# Patient Record
Sex: Male | Born: 1975 | Race: White | Hispanic: No | Marital: Single
Health system: Midwestern US, Community
[De-identification: ages and names within clinical notes are randomized; demographics above are authoritative.]

## PROBLEM LIST (undated history)

## (undated) DIAGNOSIS — I82409 Acute embolism and thrombosis of unspecified deep veins of unspecified lower extremity: Secondary | ICD-10-CM

## (undated) DIAGNOSIS — I2699 Other pulmonary embolism without acute cor pulmonale: Secondary | ICD-10-CM

## (undated) DIAGNOSIS — I10 Essential (primary) hypertension: Secondary | ICD-10-CM

## (undated) DIAGNOSIS — E119 Type 2 diabetes mellitus without complications: Secondary | ICD-10-CM

## (undated) DIAGNOSIS — M67912 Unspecified disorder of synovium and tendon, left shoulder: Secondary | ICD-10-CM

## (undated) DIAGNOSIS — N200 Calculus of kidney: Secondary | ICD-10-CM

## (undated) HISTORY — DX: Calculus of kidney: N20.0

## (undated) HISTORY — PX: BACK SURGERY: SHX140

## (undated) HISTORY — PX: CHOLECYSTECTOMY: SHX55

## (undated) HISTORY — PX: APPENDECTOMY (OPEN): SHX54

## (undated) HISTORY — DX: Type 2 diabetes mellitus without complications: E11.9

## (undated) HISTORY — PX: APPENDECTOMY: SHX54

## (undated) HISTORY — PX: FOOT AMPUTATION: SHX951

---

## 2011-09-12 ENCOUNTER — Observation Stay
Admission: EM | Admit: 2011-09-12 | Disposition: A | Discharge status: Home / Self Care | Payer: Self-pay | Source: Ambulatory Visit | Admitting: Surgery

## 2011-10-30 ENCOUNTER — Inpatient Hospital Stay
Admission: EM | Admit: 2011-10-30 | Disposition: A | Discharge status: Home / Self Care | Payer: Self-pay | Source: Ambulatory Visit | Admitting: Internal Medicine

## 2012-07-26 LAB — CBC AND DIFFERENTIAL
Basophils %: 0.3 % (ref 0.0–3.0)
Basophils %: 0.7 % (ref 0.0–3.0)
Basophils Absolute: 0 10*3/uL (ref 0.0–0.3)
Basophils Absolute: 0 10*3/uL (ref 0.0–0.3)
Eosinophils %: 3 % (ref 0.0–7.0)
Eosinophils %: 5.6 % (ref 0.0–7.0)
Eosinophils Absolute: 0.3 10*3/uL (ref 0.0–0.8)
Eosinophils Absolute: 0.3 10*3/uL (ref 0.0–0.8)
Hematocrit: 38.3 % — ABNORMAL LOW (ref 39.0–52.5)
Hematocrit: 45.7 % (ref 39.0–52.5)
Hemoglobin: 12.7 gm/dL — ABNORMAL LOW (ref 13.0–17.5)
Hemoglobin: 15.2 gm/dL (ref 13.0–17.5)
Lymphocytes Absolute: 1.3 10*3/uL (ref 0.6–5.1)
Lymphocytes Absolute: 1.5 10*3/uL (ref 0.6–5.1)
Lymphocytes: 15.2 % (ref 15.0–46.0)
Lymphocytes: 25.2 % (ref 15.0–46.0)
MCH: 30 pg (ref 28–35)
MCH: 30 pg (ref 28–35)
MCHC: 33 gm/dL (ref 33–37)
MCHC: 33 gm/dL (ref 33–37)
MCV: 89 fL (ref 80–100)
MCV: 91 fL (ref 80–100)
MPV: 8.6 fL (ref 8.0–12.0)
MPV: 8.8 fL (ref 8.0–12.0)
Monocytes Absolute: 0.6 10*3/uL (ref 0.1–1.7)
Monocytes Absolute: 0.8 10*3/uL (ref 0.1–1.7)
Monocytes: 10 % (ref 3.0–15.0)
Monocytes: 9.8 % (ref 3.0–15.0)
Neutrophils %: 58.5 % (ref 42.0–78.0)
Neutrophils %: 71.7 % (ref 42.0–78.0)
Neutrophils Absolute: 3.5 10*3/uL (ref 1.7–8.6)
Neutrophils Absolute: 6.1 10*3/uL (ref 1.7–8.6)
PLT CT: 198 10*3/uL (ref 130–440)
PLT CT: 246 10*3/uL (ref 130–440)
RBC: 4.21 10*6/uL (ref 4.00–5.70)
RBC: 5.12 10*6/uL (ref 4.00–5.70)
RDW: 13.1 % (ref 12.0–15.0)
RDW: 13.1 % (ref 12.0–15.0)
WBC: 6 10*3/uL (ref 4.00–11.00)
WBC: 8.5 10*3/uL (ref 4.00–11.00)

## 2012-07-26 LAB — COMPREHENSIVE METABOLIC PANEL
ALT: 32 U/L (ref 0–55)
ALT: 40 U/L (ref 0–55)
AST (SGOT): 15 U/L (ref 10–42)
AST (SGOT): 16 U/L (ref 10–42)
Albumin/Globulin Ratio: 1.07 Ratio (ref 0.70–1.50)
Albumin/Globulin Ratio: 1.11 Ratio (ref 0.70–1.50)
Albumin: 3 gm/dL — ABNORMAL LOW (ref 3.5–5.0)
Albumin: 4 gm/dL (ref 3.5–5.0)
Alkaline Phosphatase: 44 U/L (ref 40–145)
Alkaline Phosphatase: 69 U/L (ref 40–145)
Anion Gap: 12 Gap (ref 7.0–16.0)
Anion Gap: 19.7 Gap — ABNORMAL HIGH (ref 7.0–16.0)
BUN / Creatinine Ratio: 12 Ratio (ref 10.0–30.0)
BUN / Creatinine Ratio: 12.3 Ratio (ref 10.0–30.0)
BUN: 12 mg/dL (ref 7–22)
BUN: 9 mg/dL (ref 7–22)
Bilirubin, Total: 0.4 mg/dL (ref 0.1–1.2)
Bilirubin, Total: 0.4 mg/dL (ref 0.1–1.2)
CO2: 24 mMol/L (ref 20.0–30.0)
CO2: 25 mMol/L (ref 20.0–30.0)
Calcium: 8.5 mg/dL (ref 8.5–10.5)
Calcium: 9.9 mg/dL (ref 8.5–10.5)
Chloride: 103 mMol/L (ref 98–110)
Chloride: 96 mMol/L — ABNORMAL LOW (ref 98–110)
Creatinine: 0.73 mg/dL — ABNORMAL LOW (ref 0.80–1.30)
Creatinine: 1 mg/dL (ref 0.80–1.30)
EGFR: >60 mL/min/{1.73_m2}
EGFR: >60 mL/min/{1.73_m2}
Globulin: 2.8 gm/dL (ref 2.0–4.0)
Globulin: 3.6 gm/dL (ref 2.0–4.0)
Glucose: 260 mg/dL — ABNORMAL HIGH (ref 70–99)
Glucose: 393 mg/dL — ABNORMAL HIGH (ref 70–99)
Osmolality Calc: 280 mOsm/kg (ref 275–300)
Osmolality Calc: 288 mOsm/kg (ref 275–300)
Potassium: 3.7 mMol/L (ref 3.5–5.3)
Potassium: 4 mMol/L (ref 3.5–5.3)
Protein, Total: 5.8 gm/dL — ABNORMAL LOW (ref 6.0–8.3)
Protein, Total: 7.6 gm/dL (ref 6.0–8.3)
Sodium: 136 mMol/L (ref 136–147)
Sodium: 136 mMol/L (ref 136–147)

## 2012-07-26 LAB — AMYLASE: Amylase: 29 U/L — ABNORMAL LOW (ref 30–135)

## 2012-07-26 LAB — URINALYSIS
Bilirubin, UA: NEGATIVE mg/dL
Blood, UA: NEGATIVE mg/dL
Glucose, UA: >=1000 mg/dL — AB
Ketones UA: 15 mg/dL — AB
Leukocyte Esterase, UA: NEGATIVE Leu/uL
Nitrite, UA: NEGATIVE
RBC, UA: NONE SEEN /hpf
Urine Protein: NEGATIVE mg/dL
Urine Specific Gravity: 1.015 (ref 1.001–1.040)
Urobilinogen, UA: 0.2 mg/dL
pH, Urine: 6 pH (ref 5.0–8.0)

## 2012-07-26 LAB — LIPASE: Lipase: 35 U/L (ref 8–78)

## 2012-07-26 LAB — VH DEXTROSE STICK GLUCOSE
Glucose POCT: 284 mg/dL — ABNORMAL HIGH (ref 70–99)
Glucose POCT: 296 mg/dL — ABNORMAL HIGH (ref 70–99)

## 2012-07-26 LAB — LACTIC ACID, PLASMA: Lactic Acid: 2.16 mMol/L (ref 0.50–2.10)

## 2012-07-27 LAB — CBC AND DIFFERENTIAL
Basophils %: 0.1 % (ref 0.0–3.0)
Basophils %: 0.4 % (ref 0.0–3.0)
Basophils %: 0.5 % (ref 0.0–3.0)
Basophils Absolute: 0 10*3/uL (ref 0.0–0.3)
Basophils Absolute: 0 10*3/uL (ref 0.0–0.3)
Basophils Absolute: 0 10*3/uL (ref 0.0–0.3)
Eosinophils %: 2.6 % (ref 0.0–7.0)
Eosinophils %: 3.4 % (ref 0.0–7.0)
Eosinophils %: 5.6 % (ref 0.0–7.0)
Eosinophils Absolute: 0.2 10*3/uL (ref 0.0–0.8)
Eosinophils Absolute: 0.3 10*3/uL (ref 0.0–0.8)
Eosinophils Absolute: 0.5 10*3/uL (ref 0.0–0.8)
Hematocrit: 36.7 % — ABNORMAL LOW (ref 39.0–52.5)
Hematocrit: 39.9 % (ref 39.0–52.5)
Hematocrit: 43 % (ref 39.0–52.5)
Hemoglobin: 12.2 gm/dL — ABNORMAL LOW (ref 13.0–17.5)
Hemoglobin: 13.1 gm/dL (ref 13.0–17.5)
Hemoglobin: 14 gm/dL (ref 13.0–17.5)
Lymphocytes Absolute: 1 10*3/uL (ref 0.6–5.1)
Lymphocytes Absolute: 1.5 10*3/uL (ref 0.6–5.1)
Lymphocytes Absolute: 2 10*3/uL (ref 0.6–5.1)
Lymphocytes: 11.4 % — ABNORMAL LOW (ref 15.0–46.0)
Lymphocytes: 19.4 % (ref 15.0–46.0)
Lymphocytes: 22.5 % (ref 15.0–46.0)
MCH: 30 pg (ref 28–35)
MCH: 30 pg (ref 28–35)
MCH: 30 pg (ref 28–35)
MCHC: 33 gm/dL (ref 33–37)
MCHC: 33 gm/dL (ref 33–37)
MCHC: 33 gm/dL (ref 33–37)
MCV: 90 fL (ref 80–100)
MCV: 90 fL (ref 80–100)
MCV: 91 fL (ref 80–100)
MPV: 8.3 fL (ref 8.0–12.0)
MPV: 8.4 fL (ref 8.0–12.0)
MPV: 8.5 fL (ref 8.0–12.0)
Monocytes Absolute: 0.6 10*3/uL (ref 0.1–1.7)
Monocytes Absolute: 0.7 10*3/uL (ref 0.1–1.7)
Monocytes Absolute: 0.8 10*3/uL (ref 0.1–1.7)
Monocytes: 6.7 % (ref 3.0–15.0)
Monocytes: 8.6 % (ref 3.0–15.0)
Monocytes: 9.3 % (ref 3.0–15.0)
Neutrophils %: 62.1 % (ref 42.0–78.0)
Neutrophils %: 68.2 % (ref 42.0–78.0)
Neutrophils %: 79.2 % — ABNORMAL HIGH (ref 42.0–78.0)
Neutrophils Absolute: 5.3 10*3/uL (ref 1.7–8.6)
Neutrophils Absolute: 5.4 10*3/uL (ref 1.7–8.6)
Neutrophils Absolute: 6.8 10*3/uL (ref 1.7–8.6)
PLT CT: 239 10*3/uL (ref 130–440)
PLT CT: 247 10*3/uL (ref 130–440)
PLT CT: 278 10*3/uL (ref 130–440)
RBC: 4.1 10*6/uL (ref 4.00–5.70)
RBC: 4.42 10*6/uL (ref 4.00–5.70)
RBC: 4.76 10*6/uL (ref 4.00–5.70)
RDW: 12.8 % (ref 12.0–15.0)
RDW: 12.9 % (ref 12.0–15.0)
RDW: 13 % (ref 12.0–15.0)
WBC: 7.7 10*3/uL (ref 4.00–11.00)
WBC: 8.6 10*3/uL (ref 4.00–11.00)
WBC: 8.7 10*3/uL (ref 4.00–11.00)

## 2012-07-27 LAB — COMPREHENSIVE METABOLIC PANEL
ALT: 34 U/L (ref 0–55)
ALT: 48 U/L (ref 0–55)
AST (SGOT): 16 U/L (ref 10–42)
AST (SGOT): 35 U/L (ref 10–42)
Albumin/Globulin Ratio: 1.11 Ratio (ref 0.70–1.50)
Albumin/Globulin Ratio: 1.13 Ratio (ref 0.70–1.50)
Albumin: 3.4 gm/dL — ABNORMAL LOW (ref 3.5–5.0)
Albumin: 3.9 gm/dL (ref 3.5–5.0)
Alkaline Phosphatase: 47 U/L (ref 40–145)
Alkaline Phosphatase: 60 U/L (ref 40–145)
Anion Gap: 11.8 Gap (ref 7.0–16.0)
Anion Gap: 16 Gap (ref 7.0–16.0)
BUN / Creatinine Ratio: 11.5 Ratio (ref 10.0–30.0)
BUN / Creatinine Ratio: 13.4 Ratio (ref 10.0–30.0)
BUN: 11 mg/dL (ref 7–22)
BUN: 9 mg/dL (ref 7–22)
Bilirubin, Total: 0.6 mg/dL (ref 0.1–1.2)
Bilirubin, Total: 0.6 mg/dL (ref 0.1–1.2)
CO2: 25 mMol/L (ref 20.0–30.0)
CO2: 27 mMol/L (ref 20.0–30.0)
Calcium: 8.7 mg/dL (ref 8.5–10.5)
Calcium: 9.8 mg/dL (ref 8.5–10.5)
Chloride: 100 mMol/L (ref 98–110)
Chloride: 103 mMol/L (ref 98–110)
Creatinine: 0.78 mg/dL — ABNORMAL LOW (ref 0.80–1.30)
Creatinine: 0.82 mg/dL (ref 0.80–1.30)
EGFR: >60 mL/min/{1.73_m2}
EGFR: >60 mL/min/{1.73_m2}
Globulin: 3 gm/dL (ref 2.0–4.0)
Globulin: 3.5 gm/dL (ref 2.0–4.0)
Glucose: 201 mg/dL — ABNORMAL HIGH (ref 70–99)
Glucose: 300 mg/dL — ABNORMAL HIGH (ref 70–99)
Osmolality Calc: 280 mOsm/kg (ref 275–300)
Osmolality Calc: 284 mOsm/kg (ref 275–300)
Potassium: 3.8 mMol/L (ref 3.5–5.3)
Potassium: 4 mMol/L (ref 3.5–5.3)
Protein, Total: 6.4 gm/dL (ref 6.0–8.3)
Protein, Total: 7.4 gm/dL (ref 6.0–8.3)
Sodium: 137 mMol/L (ref 136–147)
Sodium: 138 mMol/L (ref 136–147)

## 2012-07-27 LAB — URINALYSIS
Bilirubin, UA: NEGATIVE mg/dL
Blood, UA: NEGATIVE mg/dL
Glucose, UA: 500 mg/dL — AB
Ketones UA: NEGATIVE mg/dL
Leukocyte Esterase, UA: NEGATIVE Leu/uL
Nitrite, UA: NEGATIVE
RBC, UA: NONE SEEN /hpf
Urine Protein: NEGATIVE mg/dL
Urine Specific Gravity: 1.01 (ref 1.001–1.040)
Urobilinogen, UA: 0.2 mg/dL
pH, Urine: 6.5 pH (ref 5.0–8.0)

## 2012-07-27 LAB — VH DEXTROSE STICK GLUCOSE
Glucose POCT: 202 mg/dL — ABNORMAL HIGH (ref 70–99)
Glucose POCT: 235 mg/dL — ABNORMAL HIGH (ref 70–99)

## 2012-07-27 LAB — BASIC METABOLIC PANEL
Anion Gap: 14.8 Gap (ref 7.0–16.0)
BUN / Creatinine Ratio: 13.8 Ratio (ref 10.0–30.0)
BUN: 11 mg/dL (ref 7–22)
CO2: 24 mMol/L (ref 20.0–30.0)
Calcium: 9.1 mg/dL (ref 8.5–10.5)
Chloride: 104 mMol/L (ref 98–110)
Creatinine: 0.8 mg/dL (ref 0.80–1.30)
EGFR: >60 mL/min/{1.73_m2}
Glucose: 266 mg/dL — ABNORMAL HIGH (ref 70–99)
Osmolality Calc: 286 mOsm/kg (ref 275–300)
Potassium: 3.8 mMol/L (ref 3.5–5.3)
Sodium: 139 mMol/L (ref 136–147)

## 2012-07-27 LAB — LIPASE: Lipase: 17 U/L (ref 8–78)

## 2012-08-07 ENCOUNTER — Emergency Department
Admission: EM | Admit: 2012-08-07 | Disposition: A | Discharge status: Home / Self Care | Payer: Self-pay | Source: Ambulatory Visit

## 2012-09-10 LAB — COMPREHENSIVE METABOLIC PANEL
ALT: 38 U/L (ref 0–55)
AST (SGOT): 21 U/L (ref 10–42)
Albumin/Globulin Ratio: 1.16 Ratio (ref 0.70–1.50)
Albumin: 4.4 gm/dL (ref 3.5–5.0)
Alkaline Phosphatase: 73 U/L (ref 40–145)
Anion Gap: 15.8 mMol/L (ref 7.0–18.0)
BUN / Creatinine Ratio: 16.3 Ratio (ref 10.0–30.0)
BUN: 16 mg/dL (ref 7–22)
Bilirubin, Total: 0.6 mg/dL (ref 0.1–1.2)
CO2: 26 mMol/L (ref 20.0–30.0)
Calcium: 11.2 mg/dL — ABNORMAL HIGH (ref 8.5–10.5)
Chloride: 99 mMol/L (ref 98–110)
Creatinine: 0.98 mg/dL (ref 0.60–1.30)
EGFR: >60 mL/min/{1.73_m2}
Globulin: 3.8 gm/dL (ref 2.0–4.0)
Glucose: 246 mg/dL — ABNORMAL HIGH (ref 70–99)
Osmolality Calc: 283 mOsm/kg (ref 275–300)
Potassium: 3.8 mMol/L (ref 3.5–5.3)
Protein, Total: 8.2 gm/dL (ref 6.0–8.3)
Sodium: 137 mMol/L (ref 136–147)

## 2012-09-10 LAB — URINALYSIS, REFLEX TO MICROSCOPIC EXAM IF INDICATED
Blood, UA: NEGATIVE mg/dL
Leukocyte Esterase, UA: NEGATIVE Leu/uL
Nitrite, UA: NEGATIVE
Urine Protein: NEGATIVE mg/dL
Urine Specific Gravity: 1.036 (ref 1.001–1.040)
Urobilinogen, UA: 0.2 mg/dL
pH, Urine: 5 pH (ref 5.0–8.0)

## 2012-09-10 LAB — CBC AND DIFFERENTIAL
Basophils %: 0.9 % (ref 0.0–3.0)
Basophils Absolute: 0.1 10*3/uL (ref 0.0–0.3)
Eosinophils %: 2.9 % (ref 0.0–7.0)
Eosinophils Absolute: 0.3 10*3/uL (ref 0.0–0.8)
Hematocrit: 44.7 % (ref 39.0–52.5)
Hemoglobin: 15.1 gm/dL (ref 13.0–17.5)
Lymphocytes Absolute: 1.8 10*3/uL (ref 0.6–5.1)
Lymphocytes: 19.3 % (ref 15.0–46.0)
MCH: 30 pg (ref 28–35)
MCHC: 34 gm/dL (ref 33–37)
MCV: 89 fL (ref 80–100)
MPV: 8.7 fL (ref 8.0–12.0)
Monocytes Absolute: 0.7 10*3/uL (ref 0.1–1.7)
Monocytes: 7.4 % (ref 3.0–15.0)
Neutrophils %: 69.5 % (ref 42.0–78.0)
Neutrophils Absolute: 6.4 10*3/uL (ref 1.7–8.6)
PLT CT: 278 10*3/uL (ref 130–440)
RBC: 5 10*6/uL (ref 4.00–5.70)
RDW: 13.5 % (ref 12.0–15.0)
WBC: 9.2 10*3/uL (ref 4.0–11.0)

## 2012-09-10 LAB — LIPASE: Lipase: 25 U/L (ref 8–78)
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
## 2012-11-25 NOTE — Discharge Summary (Signed)
[facility]                               DISCHARGE SUMMARY              NAME: Henry Stephens, Henry Stephens                      ADMITTED:   09/12/2011       MR#:  [id number]                            DISCHARGED:       DOB:  1975/12/30                           ACCT#:      [id number]       _________________________________________________________________       DISCHARGE DIAGNOSIS:       Abdominal symptoms.              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  His symptoms were quite intense.  He       was evaluated in the ER and then admitted to the Surgical       Service.  The patient has a history of diabetes and history of       necrotizing fasciitis in the remote past.  He underwent an       evaluation, which included a CT scan, which was rather       unrevealing, and then he had a hepatobiliary scan, which was       unrevealing, however, the administration of CCK did reproduce       his symptoms somewhat.  The patient possibly has a symptomatic       gallbladder.  I discussed a bunch of management options with       him, this included further evaluation of his gallbladder and       possible cholecystectomy.  The patient admits that he wishes to       be discharged home.  He is feeling better, and lives in       [place], [place].  His family and his physicians are back in       [place].  The patient has a truck, which is in the driveway of       the hospital, and he needs to get that home also.              Per the patient's request, he will be discharged home.  He is       feeling better.  His abdominal exam is improved.  He will follow       up as soon as he can with his physician back in [place],       [place] and hopefully, he will be further evaluated.  He very       well might need a cholecystectomy at some point.               The patient knows to return to the emergency room nearest to him       if his symptoms should  become intense again, or should he have       any nausea, vomiting, fevers or further right upper quadrant       symptoms.  He agrees.  The patient will continue all his usual       home medications.  He will call with any questions or concerns       in the meantime.                                            **PRELIMINARY REPORT UNLESS SIGNED**                                SEE DOCUMENT IMAGING SYSTEM FOR FINAL REPORT                                                        ________________________________                                        Vergia Alberts, MD                                                                                                                  ID:   [id number]                                                          DocType:   [id number]       \:   RR                                                                 /:   [id number]       DD:  09/14/2011 13:49:33                                                DT:  09/14/2011 15:06:59  JOB: [id number]                                                            CC:  PHYSICIAN NO PRIMARY (1002)            DR NO REFERRAL (1000)                   >                                                                  Authenticated by Vergia Alberts., MD On 09/27/2011 04:42:39 PM

## 2012-11-25 NOTE — Op Note (Signed)
[facility]                           OPERATIVE/PROCEDURE REPORT              NAME: Henry Stephens, Henry Stephens               ADMITTED:   10/30/2011       MR#:  [id number]                            ACCT#:      [id number]       DOB:  1976-06-02       _________________________________________________________________       DATE OF SURGERY:       10/30/2011              INDICATIONS:       The patient is a 37 year old male, who has a symptomatic       gallbladder, he has chronic acalculous cholecystitis.              PREOPERATIVE DIAGNOSIS:       Symptomatic gallbladder.              POSTOPERATIVE DIAGNOSIS:       Symptomatic gallbladder.              PROCEDURE:       Laparoscopic cholecystectomy.              SURGEON:       Frederica Kuster, MD.              ANESTHESIA:       General.              COMPLICATIONS:       None.              FINDINGS:       The patient had chronic acalculous cholecystitis.              SUMMARY OF THE PROCEDURE:       The patient was identified and taken to the OR, where he was       placed supine.  General anesthesia was administered by the       anesthesia department.  The patient had received perioperative       antibiotics, subcutaneous Lovenox, and pneumatic boots were in       place and functioning.              Time-out was performed.  The patient's abdomen was clipped,       prepped with Hibiclens solution and draped in a sterile fashion.       An incision was made underneath the umbilicus in a transverse       orientation, and using the Hasson technique, a 12 mm port was       placed.  A pneumoperitoneum was created and a laparoscopic       equipment was then inserted.  The camera was used to visualize       throughout the abdomen.  The gallbladder was noted in the right       upper quadrant underneath the costal margin.              The 5 mm port was placed in the subxiphoid position and another       two ports were placed in the right upper  quadrant  underneath the       costal margin.  The fundus of the gallbladder was grasped and       retracted up over the liver.  The neck of the gallbladder       retracted laterally.  The gallbladder appeared chronically       inflamed.  There was fibrotic changes.  The peritoneum overlying       the cystic duct and the cystic artery was carefully dissected       and these structures were skeletonized.  The cystic duct              gallbladder junction was identified.  The cystic duct was       clipped proximally and distally and between multiple clips it       was divided.  The cystic artery was similarly divided between       multiple clips.  The gallbladder was then removed from the       undersurface of the liver using electrocautery.  It was placed       in an Endobag and brought from the abdomen through the umbilical       port site and handed off the field for pathologic review.              The right upper quadrant was hemostatic.  It was irrigated and       suctioned dry.  No other intra-abdominal pathology was       identified.  At this point, all laparoscopic ports were removed       under direct vision and the port sites were hemostatic.  The       fascia at the umbilicus was approximated using interrupted 0       Vicryl sutures and the skin at all four port sites was       approximated using 4-0 subcuticular Monocryl.  Dermabond was       placed.  The patient was transferred to recovery room in stable       condition.  All counts were correct.  No complications were       encountered.                                            **PRELIMINARY REPORT UNLESS SIGNED**                                SEE DOCUMENT IMAGING SYSTEM FOR FINAL REPORT                                                        ________________________________                                        Vergia Alberts, MD  ID:   [id number]                                                           DocType:   [id number]       \:   JR                                                                 /:   [id number]       DD:  10/31/2011 13:53:03                                                DT:  11/01/2011 01:31:49                                                JOB: [id number]                                                            CC:  PHYSICIAN NO PRIMARY (1002)            DR NO REFERRAL (1000)                   >                                                                  Authenticated by Vergia Alberts., MD On 12/08/2011 10:33:38 AM

## 2012-11-25 NOTE — H&P (Signed)
[facility]                             HISTORY AND PHYSICAL              NAME: Henry Stephens, Henry Stephens                      ADMITTED:   09/12/2011       MR#:  [id number]                            ACCT#:      [id number]       DOB:  May 27, 1976       _________________________________________________________________       CAUSE FOR ADMISSION:       Right upper quadrant pain.              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  He had had a similar episode once before in the       remote past, which resolved spontaneously.  This continued to       persist and he presented to the emergency department.  He also       began noting nausea and had emesis approximately at the same       time when he arrived at the emergency room.  The patient had       taken antacids which did not resolve the discomfort.              PAST HISTORY:       Significant for history of adult-onset diabetes mellitus treated       with oral medications and a past history of nephrolithiasis.  He       also had necrotizing fasciitis of the perineum requiring       debridement and surgery in the past, but denies any problems at       that area at this point.              MEDICATIONS:       Included glyburide/metformin.              ALLERGIES:       None.              PHYSICAL EXAMINATION:       GENERAL:  Exam revealed him to be awake, alert, oriented, and       cooperative, but in considerable discomfort because of right       upper quadrant abdominal pain.       VITAL SIGNS:  Temperature of 36.3, heart rate of 79,       respirations of 18, and blood pressure 131/74.       CHEST:  Auscultation of the chest revealed no adventitious       sounds.       HEART:  Examination revealed a regular rhythm without murmurs  or       gallops.       ABDOMEN:  Examination revealed it to be somewhat distended with  diffuse guarding, but no masses were palpable and there was no       rebound.  Tenderness was localized to the right upper quadrant.              DIAGNOSTIC DATA:       The patient had been sent by the emergency room physicians for a       CT scan, which was reported as unremarkable.  His laboratory       work likewise was unremarkable, except for elevated blood       glucose and lactic acid was slightly elevated at 2.16.              During the course of the next few hours, the pain persisted and       he was sent for a HIDA scan.  The HIDA scan revealed very       delayed concentration of the radionucleotide in the gallbladder,       taking approximately two hours to appear.  When the       cholecystokinin was administered, the patient said that the pain              was aggravated and was like the pain that he had been brought to       the emergency room with.  He was continuing to have nausea,       although this was being controlled with the Zofran.              IMPRESSION:       Biliary dyskinesia.              PLAN:       The patient will be n.p.o. after midnight.  He will be       re-evaluated and if he is pain free, he will be allowed to go       home and arrange gallbladder surgery at his own home.  If he has       continued pain, he will need laparoscopic cholecystectomy, and       he understands that he will be unable to drive for at least       several days afterwards, which would require someone to help him       to move his truck back to his home location.                                            **PRELIMINARY REPORT UNLESS SIGNED**                                SEE DOCUMENT IMAGING SYSTEM FOR FINAL REPORT                I hereby certify this patient for hospitalization based upon        medical necessity as noted above.                                                       ________________________________  Randalyn Rhea, MD                                                                                                                 ID:   [id number]                                                          DocType:   [id number]       \:   SL                                                                 /:   [id number]       DD:  09/13/2011 17:27:31                                                DT:  09/14/2011 02:55:52                                                JOB: [id number]                                                            CC:  PHYSICIAN NO PRIMARY (1002)            DR NO REFERRAL (1000)                   >                                                                  Authenticated by Randalyn Rhea., MD On 09/20/2011 10:22:55 AM

## 2013-02-24 ENCOUNTER — Emergency Department
Admission: EM | Admit: 2013-02-24 | Discharge: 2013-02-24 | Disposition: A | Discharge status: Home / Self Care | Payer: Self-pay | Attending: Sports Medicine" | Admitting: Sports Medicine"

## 2013-02-24 ENCOUNTER — Emergency Department: Payer: Self-pay

## 2013-02-24 DIAGNOSIS — R52 Pain, unspecified: Secondary | ICD-10-CM | POA: Insufficient documentation

## 2013-02-24 DIAGNOSIS — E119 Type 2 diabetes mellitus without complications: Secondary | ICD-10-CM | POA: Insufficient documentation

## 2013-02-24 DIAGNOSIS — Z87891 Personal history of nicotine dependence: Secondary | ICD-10-CM | POA: Insufficient documentation

## 2013-02-24 DIAGNOSIS — M545 Low back pain, unspecified: Secondary | ICD-10-CM | POA: Insufficient documentation

## 2013-02-24 DIAGNOSIS — G8929 Other chronic pain: Secondary | ICD-10-CM | POA: Insufficient documentation

## 2013-02-24 MED ORDER — PREDNISONE 20 MG PO TABS
ORAL_TABLET | ORAL | Status: AC
Start: 2013-02-24 — End: ?
  Filled 2013-02-24: qty 3

## 2013-02-24 MED ORDER — HYDROCODONE-ACETAMINOPHEN 5-325 MG PO TABS
1.0000 | ORAL_TABLET | Freq: Once | ORAL | Status: DC
Start: 2013-02-24 — End: 2013-02-24

## 2013-02-24 MED ORDER — PREDNISONE 20 MG PO TABS
60.00 mg | ORAL_TABLET | Freq: Once | ORAL | Status: AC
Start: 2013-02-24 — End: 2013-02-24
  Administered 2013-02-24: 60 mg via ORAL

## 2013-02-24 MED ORDER — PREDNISONE 20 MG PO TABS
ORAL_TABLET | ORAL | Status: AC
Start: 2013-02-24 — End: ?

## 2013-02-24 MED ORDER — HYDROCODONE-ACETAMINOPHEN 5-325 MG PO TABS
ORAL_TABLET | ORAL | Status: AC
Start: 2013-02-24 — End: ?
  Filled 2013-02-24: qty 1

## 2013-02-24 MED ORDER — TRAMADOL HCL 50 MG PO TABS
50.0000 mg | ORAL_TABLET | Freq: Once | ORAL | Status: AC
Start: 2013-02-24 — End: 2013-02-24
  Administered 2013-02-24: 50 mg via ORAL

## 2013-02-24 MED ORDER — TRAMADOL HCL 50 MG PO TABS
ORAL_TABLET | ORAL | Status: AC
Start: 2013-02-24 — End: ?
  Filled 2013-02-24: qty 1

## 2013-02-24 MED ORDER — METHYLPREDNISOLONE SODIUM SUCC 125 MG IJ SOLR
80.00 mg | Freq: Once | INTRAMUSCULAR | Status: DC
Start: 2013-02-24 — End: 2013-02-24

## 2013-02-24 NOTE — ED Provider Notes (Signed)
Physician/Midlevel provider first contact with patient: 02/24/13 1332         [facility] EMERGENCY DEPARTMENT History and Physical Exam      Date: 02/24/2013  Patient Name: Henry Stephens  Attending Physician: No att. providers found  Physician Extender: Merryl Hacker PA-C  PCP: Christa See, MD  Patient DOB:  July 18, 1975  MRN:  [id number]  Room:  E6/ED6-A  History of Presenting Illness     Chief complaint: Back Pain    HPI/ROS is limited by: none  HPI/ROS given by: patient      Context: Henry Stephens is a 37 y.o. male who presents with c/o low back pain  Location: lower lumbar spine Severity: moderate  Duration: chronic pain, intermittent   Quality: aching, states pain radiates to left leg, states decreased sensation in left lower leg as well.   Associated Signs/ Symptoms: Denies loss of bowel or bladder control.  States  Chronic weakness in left lower leg since surgery a year ago.  States has had a fusion of L4-L5 with cage around same.  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  States he has had worsening pain and stiffness since that began.  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      Review of Systems     Review of Systems   Constitutional: Negative.    Cardiovascular: Negative for leg swelling.   Gastrointestinal: Negative.    Genitourinary: Negative.         No loss of bowel or bladder control   Musculoskeletal: Positive for myalgias and back pain. Negative for joint pain and falls.   Skin: Negative.    Neurological: Positive for sensory change. Negative for dizziness, tingling, tremors, focal weakness and headaches.   Psychiatric/Behavioral: Negative for depression.          Allergies     Pt is allergic to lortab; tramadol; and toradol.    Medications     Current Outpatient Rx   Name  Route  Sig  Dispense  Refill   . GLYBURIDE 5 MG PO TABS    Oral    Take 10 mg by mouth 2 (two) times daily with meals.              Marland Kitchen PREDNISONE 20 MG PO TABS        Take 3 tablets daily for 5 days    15 tablet    0          Past Medical History     Pt has a past medical history of Diabetes mellitus.    Past Surgical History     Pt has past surgical history that includes Back surgery; Appendectomy; and Cholecystectomy.    Family History     The family history is not on file.    Social History     Pt reports that he has quit smoking. He does not have any smokeless tobacco history on file. He reports that he does not drink alcohol or use illicit drugs.    Physical Exam     CONSTITUTIONAL: Well-developed, well-nourished. Alert, cooperative and in   no acute distress. Vital signs reviewed.   HEAD: Normocephalic, atraumatic.  NECK: Non tender. Full ROM without pain. No adenopathy. No JVD.   RESPIRATORY: Good air movement bilaterally. No wheezing, rales or rhonchi.   No retractions or use of accessory muscles. No respiratory distress.   CARDIAC: RRR. Normal S1 and S2  without murmurs, rubs or gallops.   GI: Soft and non-tender throughout. Non-distended, normal bowel sounds, no   organomegaly, no peritoneal signs. No CVA tenderness.   MS: + tenderness to palpation along lumbar spine, and left SI area.  Decreased  ROM due to pain. No edema, erythema or STS. Pulses 2+ and symmetric.    SKIN: Warm and dry. No rash or lesions. No abrasions or breaks in skin.  NEURO: A&O x 3. 5/5 strength in bilateral  lower extremities. Sensory intact throughout. Reflexes 2+ and equal bilateral lower extremities.     PSYCH: Normal affect without anxiety or depression.      Orders Placed     Orders Placed This Encounter   Procedures   . XR Lumbar Spine 4+ Views           ED Medications Administered     ED Medication Orders      Start     Status Ordering Provider    02/24/13 1550   predniSONE (DELTASONE) tablet 60 mg   Once in ED      Route: Oral  Ordered Dose: 60 mg         Last MAR action:  Given Henry Stephens    02/24/13 1549      Once in ED,   Status:   Discontinued      Route: Intramuscular  Ordered Dose: 80 mg         Discontinued Henry Stephens    02/24/13 1424   traMADol (ULTRAM) tablet 50 mg   Once in ED      Route: Oral  Ordered Dose: 50 mg         Last MAR action:  Given Henry Stephens    02/24/13 1352      Once in ED,   Status:  Discontinued      Route: Oral  Ordered Dose: 1 tablet         Discontinued Henry Stephens                Diagnostic Results       The results of the diagnostic studies below have been reviewed by myself:    Labs  Results     ** No Results found for the last 24 hours. **          Radiologic Studies  Radiology Results (24 Hour)     Procedure Component Value Units Date/Time    XR Lumbar Spine 4+ Views [[id number]] Collected:02/24/13 1547    Order Status:Completed  Updated:02/24/13 1549    Narrative:    Clinical History:  back pain, history of surgery 1 year ago    Examination:  AP, lateral, spot and both oblique views of the lumbar spine.    Comparison:  None available.    Findings:  The posterior fusion at L4 and 5 is intact with minor anterolisthesis. Prosthetic disc appears good position.  Laminectomies noted at these levels. Posterior osteophyte at L4 is chronic.    The other lumbar vertebra and discs are within normal limits. No fracture or acute findings.    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 No acute findings.              MDM / Critical Care     Blood pressure 168/81, pulse 77, temperature 99.2 F (37.3 C), resp. rate 18, height 1.88 m, weight 111.131 kg, SpO2 100.00%.    MDM  Number of Diagnoses or Management Options  Acute exacerbation of chronic low back pain: established, worsening  Diagnosis management comments: This patient presents with back/neck pain that seems musculoskeletal in origin. Based on my history and examination, several diagnoses including cauda equina syndrome, infection, AAA, kidney stones, have been considered and are  unlikely. No life-threatening etiologies of back pain are discernible at this time, and the patient's symptoms will be managed with symptomatic care. I advised the patient to return to the emergency department immediately should they develop worsening pain, fever, weakness, bowel or bladder symptoms, or any acute concerns .  The patient was deemed well enough for discharge.  Diagnostic impression and plan were discussed with the patient and/or family.  If ordered, results of lab/radiology tests were discussed with the patient and/or family. Questions were answered and concerns were addressed.  The patient was encouraged to follow-up with their primary care provider or specialist if not improved.         Amount and/or Complexity of Data Reviewed  Tests in the radiology section of CPT: ordered and reviewed  Discuss the patient with other providers: yes    Risk of Complications, Morbidity, and/or Mortality  Presenting problems: high  Diagnostic procedures: moderate  Management options: moderate  General comments: Review of patient's PMP revealed 5 pages of narcotic rx in the past 12 months from multiple providers and multiple places.  Last rx was on the 16th of August.  Patient advised that we do not treat chronic pain with narcotic medications.  Discussed normal xray results.      Patient Progress  Patient progress: stable    Advised patient that I did review his rx history and he had just received narcotics recently.      Procedures         n/a    Diagnosis / Disposition     Clinical Impression  1. Acute exacerbation of chronic low back pain        Disposition  ED Disposition     Discharge Henry Stephens discharge to home/self care.    Condition at disposition: Stable            Vital signs were reviewed at the time of disposition.  Patient Vitals for the past 24 hrs:   BP Temp Pulse Resp SpO2 Height Weight   02/24/13 1541 168/81 mmHg - 77  18  100 % - -   02/24/13 1317 155/81 mmHg 99.2 F (37.3 C) 91  20   100 % 1.88 m 111.131 kg          Prescriptions  New Prescriptions    PREDNISONE (DELTASONE) 20 MG TABLET    Take 3 tablets daily for 5 days                 Merryl Hacker [place], [place]  02/24/13 [phone]

## 2013-02-24 NOTE — Discharge Instructions (Signed)
Exercises To Strengthen Your Lower Back  Strong lower-back and abdominal muscles work together to support your spine. These exercises will help strengthen the muscles of the lower back. It is important that you begin exercising slowly and increase levels gradually.  Always begin any exercise program with stretching. If you feel pain while doing any of these exercises, stop and talk to your doctor about a more specific exercise program that suits your condition better.  Low Back Stretch   Lie on your back with your knees bent and both feet on the ground.    Slowly raise your left knee to your chest as you flatten your lower back against the floor. Hold for 5 seconds.   Relax and repeat the exercise with your right knee.   Do 10 of these exercises for each leg.   Repeat hugging both knees to your chest at the same time.  Building Lower Back Strength  1. Kneeling Lumbar Extension: Begin on your hands and knees. Simultaneously raise and straighten your right arm and left leg until they are parallel to the ground. Hold for 2 seconds and come back slowly to a starting position. Repeat with left arm and right leg, alternating 10 times.  2. Prone Lumbar Extension: Lie face down, arms extended overhead, palms on the floor. Simultaneously raise your right arm and left leg as high as comfortably possible. Hold for 10 seconds and slowly return to start. Repeat with left arm and right leg, alternating 10 times. Gradually build up to 20 times. (Advanced: Repeat this exercise raising both arms and both legs a few inches off the floor at the same time. Hold for 5 seconds and release.)  3. Pelvic Tilt: Lie on the floor on your back with your knees bent at 90 degrees. Your feet should be flat on the floor. Inhale, exhale, then slowly contract your abdominal muscles bringing your navel toward your spine. Let your pelvis rock back until your lower back is flat on the floor. Hold for 10 seconds while breathing  smoothly.  4. Abdominal Crunch: Perform a Pelvic Tilt (above) flattening your lower back against the floor. Holding the tension in your abdominal muscles, take another breath and raise your shoulder blades off the ground (this is not a full sit-up). Keep your head in line with your body (don't bend your neck forward). Hold for 2 seconds, then slowly lower.   2000-2014 [employer], [address], [place] [address]. All rights reserved. This information is not intended as a substitute for professional medical care. Always follow your healthcare professional's instructions.    Thank you for choosing [facility] for your emergency care needs. We strive to provide EXCELLENT care to you and your family.      YOUR ACCURATE CONTACT INFORMATION IS VERY IMPORTANT    Before leaving please check with registration to make sure we have an up-to-date contact number. A Toll-free post discharge customer service number is available to update your registration/insurance information as well as answer any billing questions or concerns. That number is [phone].    DISCHARGE MESSAGE:    You are the most important factor in your recovery.  Follow the above instructions carefully.  Take your medications as prescribed.  Most important, see your doctor in follow-up as recommended by your ED Physician.    IF YOU DO NOT CONTINUE TO IMPROVE OR YOU HAVE ANY NEW, WORSENING OR SEVERE SYMPTOMS, PLEASE CONTACT YOUR DOCTOR.  IF YOU REQUIRE IMMEDIATE ASSISTANCE, RETURN IMMEDIATELY   TO THE EMERGENCEY DEPARTMENT OR CALL 911.    MEDICAL RECORDS AND TESTS    Certain laboratory test results do not come back the same day, for example: urine cultures may take 3 days. We will attempt to contact you if other important findings are noted. Some lab testing may take 2-5 days. Radiology films are reviewed again to ensure accuracy. If there is any discrepancy, we will notify you.    EXTRA AVAILABLE RESOURCES:    1. DOCTOR  REFERRALS  Call  our Physician Referral Line @ [phone]  2. FREE HEALTH SERVICES  a. www.freemedicalsearch.org  b. http://[email or web address]  May be utilized if you need help with health or social services, please call 2-1-1 for a free referral to resources in your area. 2-1-1 is a free service connecting people with information on health insurance, free clinics, pregnancy, mental health, dental care, food assistance, housing, and substance abuse counseling.    Pharmacy information:  Prescriptions can be filled at the pharmacy of your choice.  The Emergency Department does not authorize prescription refills.  Please contact your primary care physician or clinic for this.    [facility] has been providing home care solutions for independent living since 1984. Servicing [place] and [place]. [facility] is a full service home medical provider of home oxygen and respiratory care, medical equipment and supplies. [phone].    Thanks Again, for allowing   [facility]   Emergency Department   to serve you.  [phone]

## 2013-02-24 NOTE — ED Notes (Signed)
Pt asking for urinal "so I don't have to get up"  Pt told this RN that his girlfriend sent him an email which reminded him that he is allergic to Tramadol and Toradol.  Tramadol gives him an upset stomach which "is what I have right now".

## 2013-02-24 NOTE — ED Notes (Signed)
I was unable to reach [employer] in [place]. I spoke with [facility] in [place]. They stated they could pick the patient up and take him to the truck stop in [facility]. They said the price would be a $5 start fee and a $2 fee per mile from pick up to drop off. Pt agreed to payment.

## 2013-02-24 NOTE — ED Notes (Signed)
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  When this RN discussed this with pt, pt stated "just call them, I will figure out a way to pay them".

## 2013-02-24 NOTE — ED Notes (Signed)
Pt has no means of transportation home. No money, no debit cards. No friends to pick him up - they all live in [place]. Will notify RN supervisor as pt is requesting a cab voucher

## 2013-02-24 NOTE — ED Notes (Signed)
Pt started 0900 with left low to mid back pain. States genital area and groin areas and upper legs feel numb. States no recent injury. History of back surgery

## 2013-02-24 NOTE — ED Notes (Signed)
Patient complaining of pain, asking for pain medication.

## 2013-02-24 NOTE — ED Notes (Signed)
Pt states his pain is no better. Used urinal.

## 2013-02-24 NOTE — ED Provider Notes (Signed)
This patient presented to the Emergency Department today and was seen by Physician Assistant Colleen Patton PA-C.  We discussed and agreed upon this patient's management/treatment and plan. Lilliann Rossetti Clark Naz Denunzio, MD        Joanna Borawski Clark, MD  02/24/13 1855

## 2013-08-18 ENCOUNTER — Emergency Department (HOSPITAL_COMMUNITY)
Admission: EM | Admit: 2013-08-18 | Discharge: 2013-08-18 | Disposition: A | Discharge status: Home / Self Care | Payer: Medicaid - Out of State | Attending: Emergency Medicine | Admitting: Emergency Medicine

## 2013-08-18 ENCOUNTER — Encounter (HOSPITAL_COMMUNITY): Payer: Self-pay | Admitting: Emergency Medicine

## 2013-08-18 ENCOUNTER — Emergency Department (HOSPITAL_COMMUNITY): Payer: Medicaid - Out of State

## 2013-08-18 DIAGNOSIS — E119 Type 2 diabetes mellitus without complications: Secondary | ICD-10-CM | POA: Insufficient documentation

## 2013-08-18 DIAGNOSIS — IMO0002 Reserved for concepts with insufficient information to code with codable children: Secondary | ICD-10-CM | POA: Insufficient documentation

## 2013-08-18 DIAGNOSIS — M549 Dorsalgia, unspecified: Secondary | ICD-10-CM

## 2013-08-18 DIAGNOSIS — Y9389 Activity, other specified: Secondary | ICD-10-CM | POA: Insufficient documentation

## 2013-08-18 DIAGNOSIS — Y92410 Unspecified street and highway as the place of occurrence of the external cause: Secondary | ICD-10-CM | POA: Insufficient documentation

## 2013-08-18 HISTORY — DX: Type 2 diabetes mellitus without complications: E11.9

## 2013-08-18 MED ORDER — NAPROXEN 250 MG PO TABS
500.0000 mg | ORAL_TABLET | Freq: Once | ORAL | Status: AC
  Administered 2013-08-18: 500 mg via ORAL
  Filled 2013-08-18: qty 2

## 2013-08-18 MED ORDER — HYDROCODONE-ACETAMINOPHEN 5-325 MG PO TABS: 2.0000 | ORAL_TABLET | ORAL | Status: DC | PRN

## 2013-08-18 MED ORDER — HYDROMORPHONE HCL PF 2 MG/ML IJ SOLN
2.0000 mg | Freq: Once | INTRAMUSCULAR | Status: AC
  Administered 2013-08-18: 2 mg via INTRAMUSCULAR
  Filled 2013-08-18: qty 1

## 2013-08-18 MED ORDER — ONDANSETRON 8 MG PO TBDP
8.0000 mg | ORAL_TABLET | Freq: Once | ORAL | Status: AC
  Administered 2013-08-18: 8 mg via ORAL
  Filled 2013-08-18: qty 1

## 2013-08-18 NOTE — ED Provider Notes (Signed)
CSN: [id number]     Arrival date & time 08/18/13  1853 History   First MD Initiated Contact with Patient 08/18/13 1905     Chief Complaint  Patient presents with  . Back Pain     (Consider location/radiation/quality/duration/timing/severity/associated sxs/prior Treatment) HPI Comments: Patient presents to the ER for evaluation of back injury. Patient reports he was helping his sister move. He was pulling a mattress while on the back of a truck, did not realize he was at the end of the truck and fell off. He went to his back. Patient reports that he is having severe low back pain and pain into the buttocks. It worsens with movement. This happened several hours ago. He reports that he is starting to feel some numbness on the insides of his legs/thighs. He did not hit his head. No loss of consciousness. No upper back pain.  Patient is a 38 y.o. male presenting with back pain.  Back Pain   Past Medical History  Diagnosis Date  . Diabetes mellitus without complication    Past Surgical History  Procedure Laterality Date  . Back surgery    . Cholecystectomy    . Appendectomy     History reviewed. No pertinent family history. History  Substance Use Topics  . Smoking status: Never Smoker   . Smokeless tobacco: Not on file  . Alcohol Use: No    Review of Systems  Musculoskeletal: Positive for back pain.  All other systems reviewed and are negative.      Allergies  Review of patient's allergies indicates no known allergies.  Home Medications  No current outpatient prescriptions on file. BP 151/73  Pulse 117  Temp(Src) 98.3 F (36.8 C) (Oral)  Resp 20  Ht 6\' 2"  (1.88 m)  Wt 245 lb (111.131 kg)  BMI 31.44 kg/m2  SpO2 97% Physical Exam  Constitutional: He is oriented to person, place, and time. He appears well-developed and well-nourished. No distress.  HENT:  Head: Normocephalic and atraumatic.  Right Ear: Hearing normal.  Left Ear: Hearing normal.  Nose: Nose normal.   Mouth/Throat: Oropharynx is clear and moist and mucous membranes are normal.  Eyes: Conjunctivae and EOM are normal. Pupils are equal, round, and reactive to light.  Neck: Normal range of motion. Neck supple.  Cardiovascular: Regular rhythm, S1 normal and S2 normal.  Exam reveals no gallop and no friction rub.   No murmur heard. Pulmonary/Chest: Effort normal and breath sounds normal. No respiratory distress. He exhibits no tenderness.  Abdominal: Soft. Normal appearance and bowel sounds are normal. There is no hepatosplenomegaly. There is no tenderness. There is no rebound, no guarding, no tenderness at McBurney's point and negative Murphy's sign. No hernia.  Musculoskeletal: Normal range of motion.       Lumbar back: He exhibits tenderness.       Back:  Neurological: He is alert and oriented to person, place, and time. He has normal strength. No cranial nerve deficit or sensory deficit. Coordination normal. GCS eye subscore is 4. GCS verbal subscore is 5. GCS motor subscore is 6.  Equal strength lower extremities  Skin: Skin is warm, dry and intact. No rash noted. No cyanosis.  Psychiatric: He has a normal mood and affect. His speech is normal and behavior is normal. Thought content normal.    ED Course  Procedures (including critical care time) Labs Review Labs Reviewed - No data to display Imaging Review No results found.  EKG Interpretation   None  MDM   Diagnosis: Back pain  Patient presents to the ER for evaluation of back pain after a fall. Patient indicates that he is having severe pain in the lower back area, more on the left and it does radiate to the left leg. He has had numbness in the insides of his thighs. He did not have any appreciable decreased strength sensation and accommodation. X-ray lumbar spine and pelvis were performed and were negative for any acute fractures. Patient analgesia, will followup as needed.   Gilda Creasehristopher J. Pollina, MD 08/18/13  2030

## 2013-08-18 NOTE — Discharge Instructions (Signed)
Take ibuprofen or Naprosyn as needed in addition to the pain medicine. [place] to have the screws in your back reevaluated.  Back Pain, Adult Low back pain is very common. About 1 in 5 people have back pain.The cause of low back pain is rarely dangerous. The pain often gets better over time.About half of people with a sudden onset of back pain feel better in just 2 weeks. About 8 in 10 people feel better by 6 weeks.  CAUSES Some common causes of back pain include:  Strain of the muscles or ligaments supporting the spine.  Wear and tear (degeneration) of the spinal discs.  Arthritis.  Direct injury to the back. DIAGNOSIS Most of the time, the direct cause of low back pain is not known.However, back pain can be treated effectively even when the exact cause of the pain is unknown.Answering your caregiver's questions about your overall health and symptoms is one of the most accurate ways to make sure the cause of your pain is not dangerous. If your caregiver needs more information, he or she may order lab work or imaging tests (X-rays or MRIs).However, even if imaging tests show changes in your back, this usually does not require surgery. HOME CARE INSTRUCTIONS For many people, back pain returns.Since low back pain is rarely dangerous, it is often a condition that people can learn to [facility] their own.   Remain active. It is stressful on the back to sit or stand in one place. Do not sit, drive, or stand in one place for more than 30 minutes at a time. Take short walks on level surfaces as soon as pain allows.Try to increase the length of time you walk each day.  Do not stay in bed.Resting more than 1 or 2 days can delay your recovery.  Do not avoid exercise or work.Your body is made to move.It is not dangerous to be active, even though your back may hurt.Your back will likely heal faster if you return to being active before your pain is gone.  Pay  attention to your body when you bend and lift. Many people have less discomfortwhen lifting if they bend their knees, keep the load close to their bodies,and avoid twisting. Often, the most comfortable positions are those that put less stress on your recovering back.  Find a comfortable position to sleep. Use a firm mattress and lie on your side with your knees slightly bent. If you lie on your back, put a pillow under your knees.  Only take over-the-counter or prescription medicines as directed by your caregiver. Over-the-counter medicines to reduce pain and inflammation are often the most helpful.Your caregiver may prescribe muscle relaxant drugs.These medicines help dull your pain so you can more quickly return to your normal activities and healthy exercise.  Put ice on the injured area.  Put ice in a plastic bag.  Place a towel between your skin and the bag.  Leave the ice on for 15-20 minutes, 03-04 times a day for the first 2 to 3 days. After that, ice and heat may be alternated to reduce pain and spasms.  Ask your caregiver about trying back exercises and gentle massage. This may be of some benefit.  Avoid feeling anxious or stressed.Stress increases muscle tension and can worsen back pain.It is important to recognize when you are anxious or stressed and learn ways to manage it.Exercise is a great option. SEEK MEDICAL CARE IF:  You have pain that is not relieved with  rest or medicine.  You have pain that does not improve in 1 week.  You have new symptoms.  You are generally not feeling well. SEEK IMMEDIATE MEDICAL CARE IF:   You have pain that radiates from your back into your legs.  You develop new bowel or bladder control problems.  You have unusual weakness or numbness in your arms or legs.  You develop nausea or vomiting.  You develop abdominal pain.  You feel faint. Document Released: 06/20/2005 Document Revised: 12/20/2011 Document Reviewed:  11/08/2010 [facility] Patient Information 2014 [place]Care, [place].

## 2013-08-18 NOTE — ED Notes (Signed)
Pt states he stepped off the back of a truck and fell hitting his back and buttocks. Pt has had a fusion L4-L5. Pt c/o pain down left leg and the inside of his legs are numb. (testicles as well). Pt did not hit head, no loc.

## 2013-08-29 ENCOUNTER — Encounter (HOSPITAL_COMMUNITY): Payer: Self-pay | Admitting: Emergency Medicine

## 2013-08-29 ENCOUNTER — Emergency Department (HOSPITAL_COMMUNITY)
Admission: EM | Admit: 2013-08-29 | Discharge: 2013-08-29 | Disposition: A | Discharge status: Home / Self Care | Payer: Medicaid - Out of State | Attending: Emergency Medicine | Admitting: Emergency Medicine

## 2013-08-29 ENCOUNTER — Emergency Department (HOSPITAL_COMMUNITY): Payer: Medicaid - Out of State

## 2013-08-29 DIAGNOSIS — E119 Type 2 diabetes mellitus without complications: Secondary | ICD-10-CM | POA: Insufficient documentation

## 2013-08-29 DIAGNOSIS — S93409A Sprain of unspecified ligament of unspecified ankle, initial encounter: Secondary | ICD-10-CM | POA: Insufficient documentation

## 2013-08-29 DIAGNOSIS — Z79899 Other long term (current) drug therapy: Secondary | ICD-10-CM | POA: Insufficient documentation

## 2013-08-29 DIAGNOSIS — S96912A Strain of unspecified muscle and tendon at ankle and foot level, left foot, initial encounter: Secondary | ICD-10-CM

## 2013-08-29 DIAGNOSIS — X500XXA Overexertion from strenuous movement or load, initial encounter: Secondary | ICD-10-CM | POA: Insufficient documentation

## 2013-08-29 DIAGNOSIS — Y92410 Unspecified street and highway as the place of occurrence of the external cause: Secondary | ICD-10-CM | POA: Insufficient documentation

## 2013-08-29 DIAGNOSIS — Y9301 Activity, walking, marching and hiking: Secondary | ICD-10-CM | POA: Insufficient documentation

## 2013-08-29 NOTE — ED Notes (Signed)
Pt presents with swelling and pain to left ankle. Pt states he rolled ankle while walking outside.

## 2013-08-29 NOTE — ED Notes (Signed)
Pt refuses ankle  Splint, Says his boots give him all the support he needs.

## 2013-08-29 NOTE — ED Notes (Addendum)
Pain, swelling lt lat malleolus, Good pedal pulse.  Alert,  Says he "rolled" his ankle when coming off a curb . Also says he "pulled my back " earlier today

## 2013-08-29 NOTE — Discharge Instructions (Signed)
Ankle Sprain An ankle sprain is an injury to the strong, fibrous tissues (ligaments) that hold your ankle bones together.  HOME CARE   Put ice on your ankle for 1 2 days or as told by your doctor.  Put ice in a plastic bag.  Place a towel between your skin and the bag.  Leave the ice on for 15-20 minutes at a time, every 2 hours while you are awake.  Only take medicine as told by your doctor.  Raise (elevate) your injured ankle above the level of your heart as much as possible for 2 3 days.  Use crutches if your doctor tells you to. Slowly put your own weight on the affected ankle. Use the crutches until you can walk without pain.  If you have a plaster splint:  Do not rest it on anything harder than a pillow for 24 hours.  Do not put weight on it.  Do not get it wet.  Take it off to shower or bathe.  If given, use an elastic wrap or support stocking for support. Take the wrap off if your toes lose feeling (numb), tingle, or turn cold or blue.  If you have an air splint:  Add or let out air to make it comfortable.  Take it off at night and to shower and bathe.  Wiggle your toes and move your ankle up and down often while you are wearing it. GET HELP RIGHT AWAY IF:   Your toes lose feeling (numb) or turn blue.  You have severe pain that is increasing.  You have rapidly increasing bruising or puffiness (swelling).  Your toes feel very cold.  You lose feeling in your foot.  Your medicine does not help your pain. MAKE SURE YOU:   Understand these instructions.  Will watch your condition.  Will get help right away if you are not doing well or get worse. Document Released: 12/07/2007 Document Revised: 03/14/2012 Document Reviewed: 01/02/2012 [facility] Patient Information 2014 [place], [place].   Rest, ice, elevate, Aleve

## 2013-08-29 NOTE — ED Provider Notes (Signed)
CSN: [id number]     Arrival date & time 08/29/13  1400 History   First MD Initiated Contact with Patient 08/29/13 1518     Chief Complaint  Patient presents with  . Ankle Injury     (Consider location/radiation/quality/duration/timing/severity/associated sxs/prior Treatment) HPI.... inversion injury to left ankle  while stepping off a sidewalk. No other injuries. No head or neck trauma. Complains of pain in lateral left ankle. Severity is mild. Some pain with weightbearing.  Past Medical History  Diagnosis Date  . Diabetes mellitus without complication    Past Surgical History  Procedure Laterality Date  . Back surgery    . Cholecystectomy    . Appendectomy     History reviewed. No pertinent family history. History  Substance Use Topics  . Smoking status: Never Smoker   . Smokeless tobacco: Not on file  . Alcohol Use: No    Review of Systems  All other systems reviewed and are negative.      Allergies  Review of patient's allergies indicates no known allergies.  Home Medications   Current Outpatient Rx  Name  Route  Sig  Dispense  Refill  . glyBURIDE (DIABETA) 5 MG tablet   Oral   Take 5 mg by mouth daily.         Marland Kitchen. HYDROcodone-acetaminophen (NORCO/VICODIN) 5-325 MG per tablet   Oral   Take 2 tablets by mouth every 4 (four) hours as needed for moderate pain.   20 tablet   0   . metFORMIN (GLUCOPHAGE) 1000 MG tablet   Oral   Take 1,000 mg by mouth 2 (two) times daily.          BP 157/83  Pulse 96  Temp(Src) 98.9 F (37.2 C) (Oral)  Resp 18  SpO2 100% Physical Exam  Constitutional: He is oriented to person, place, and time. He appears well-developed and well-nourished.  HENT:  Head: Normocephalic.  Musculoskeletal:  Left lower extremity: Minimal tenderness and edema around the left lateral malleolus.  Pain with range of motion.  Neurological: He is alert and oriented to person, place, and time.  Skin: Skin is warm and dry.  Psychiatric: He  has a normal mood and affect.    ED Course  Procedures (including critical care time) Labs Review Labs Reviewed - No data to display Imaging Review Dg Ankle Complete Left  08/29/2013   CLINICAL DATA:  Step-off side walk and turned ankle, pain and swelling at lateral malleolus  EXAM: LEFT ANKLE COMPLETE - 3+ VIEW  COMPARISON:  None  FINDINGS: Osseous mineralization normal.  Ankle joint alignment normal.  Corticated non fused ossicles adjacent to lateral malleolus, appear old.  No acute fracture, dislocation, or bone destruction.  On the lateral view mild tibiotalar degenerative changes and joint space narrowing are identified.  IMPRESSION: No acute osseous abnormalities.  Old appearing ossicles at lateral malleolus.  Degenerative changes tibiotalar joint.   Electronically Signed   By: Ulyses SouthwardMark  Boles M.D.   On: 08/29/2013 15:09    EKG Interpretation   None       MDM   Final diagnoses:  Left ankle strain    Plain films of left ankle negative. Ankle brace, ice, elevate, Aleve    Donnetta HutchingBrian Garris Melhorn, MD 08/29/13 [phone]

## 2013-08-29 NOTE — ED Notes (Signed)
Pt refused.

## 2013-11-02 ENCOUNTER — Encounter (HOSPITAL_COMMUNITY): Payer: Self-pay | Admitting: Emergency Medicine

## 2013-11-02 ENCOUNTER — Emergency Department (HOSPITAL_COMMUNITY)
Admission: EM | Admit: 2013-11-02 | Discharge: 2013-11-02 | Disposition: A | Discharge status: Home / Self Care | Payer: Medicaid - Out of State | Attending: Emergency Medicine | Admitting: Emergency Medicine

## 2013-11-02 ENCOUNTER — Emergency Department (HOSPITAL_COMMUNITY): Payer: Medicaid - Out of State

## 2013-11-02 DIAGNOSIS — E119 Type 2 diabetes mellitus without complications: Secondary | ICD-10-CM | POA: Insufficient documentation

## 2013-11-02 DIAGNOSIS — M549 Dorsalgia, unspecified: Secondary | ICD-10-CM

## 2013-11-02 DIAGNOSIS — Z9089 Acquired absence of other organs: Secondary | ICD-10-CM | POA: Insufficient documentation

## 2013-11-02 DIAGNOSIS — R1032 Left lower quadrant pain: Secondary | ICD-10-CM | POA: Insufficient documentation

## 2013-11-02 DIAGNOSIS — R110 Nausea: Secondary | ICD-10-CM | POA: Insufficient documentation

## 2013-11-02 DIAGNOSIS — Z79899 Other long term (current) drug therapy: Secondary | ICD-10-CM | POA: Insufficient documentation

## 2013-11-02 DIAGNOSIS — R109 Unspecified abdominal pain: Secondary | ICD-10-CM

## 2013-11-02 DIAGNOSIS — Z87442 Personal history of urinary calculi: Secondary | ICD-10-CM | POA: Insufficient documentation

## 2013-11-02 DIAGNOSIS — Z9889 Other specified postprocedural states: Secondary | ICD-10-CM | POA: Insufficient documentation

## 2013-11-02 LAB — URINE MICROSCOPIC-ADD ON

## 2013-11-02 LAB — URINALYSIS, ROUTINE W REFLEX MICROSCOPIC
Bilirubin Urine: NEGATIVE
Glucose, UA: >1000 mg/dL — AB
Ketones, ur: NEGATIVE mg/dL
Leukocytes, UA: NEGATIVE
NITRITE: NEGATIVE
PH: 5.5 (ref 5.0–8.0)
Protein, ur: NEGATIVE mg/dL
SPECIFIC GRAVITY, URINE: 1.01 (ref 1.005–1.030)
UROBILINOGEN UA: 0.2 mg/dL (ref 0.0–1.0)

## 2013-11-02 LAB — BASIC METABOLIC PANEL
BUN: 18 mg/dL (ref 6–23)
CHLORIDE: 92 meq/L — AB (ref 96–112)
CO2: 24 mEq/L (ref 19–32)
Calcium: 9.3 mg/dL (ref 8.4–10.5)
Creatinine, Ser: 0.72 mg/dL (ref 0.50–1.35)
GFR calc non Af Amer: >90 mL/min (ref >90)
GLUCOSE: 516 mg/dL — AB (ref 70–99)
POTASSIUM: 4 meq/L (ref 3.7–5.3)
SODIUM: 132 meq/L — AB (ref 137–147)

## 2013-11-02 LAB — CBC WITH DIFFERENTIAL/PLATELET
Basophils Absolute: 0 10*3/uL (ref 0.0–0.1)
Basophils Relative: 1 % (ref 0–1)
EOS ABS: 0.4 10*3/uL (ref 0.0–0.7)
Eosinophils Relative: 5 % (ref 0–5)
HCT: 42.7 % (ref 39.0–52.0)
HEMOGLOBIN: 14.8 g/dL (ref 13.0–17.0)
Lymphocytes Relative: 13 % (ref 12–46)
Lymphs Abs: 1 10*3/uL (ref 0.7–4.0)
MCH: 30.8 pg (ref 26.0–34.0)
MCHC: 34.7 g/dL (ref 30.0–36.0)
MCV: 89 fL (ref 78.0–100.0)
MONOS PCT: 8 % (ref 3–12)
Monocytes Absolute: 0.6 10*3/uL (ref 0.1–1.0)
NEUTROS ABS: 5.9 10*3/uL (ref 1.7–7.7)
NEUTROS PCT: 73 % (ref 43–77)
PLATELETS: 246 10*3/uL (ref 150–400)
RBC: 4.8 MIL/uL (ref 4.22–5.81)
RDW: 12.6 % (ref 11.5–15.5)
WBC: 8 10*3/uL (ref 4.0–10.5)

## 2013-11-02 MED ORDER — SODIUM CHLORIDE 0.9 % IV SOLN
INTRAVENOUS | Status: DC
  Administered 2013-11-02: 1000 mL via INTRAVENOUS

## 2013-11-02 MED ORDER — HYDROCODONE-ACETAMINOPHEN 5-325 MG PO TABS: 2.0000 | ORAL_TABLET | ORAL | Status: DC | PRN

## 2013-11-02 MED ORDER — IOHEXOL 300 MG/ML  SOLN
50.0000 mL | Freq: Once | INTRAMUSCULAR | Status: AC | PRN
  Administered 2013-11-02: 50 mL via ORAL

## 2013-11-02 MED ORDER — HYDROMORPHONE HCL PF 1 MG/ML IJ SOLN
1.0000 mg | Freq: Once | INTRAMUSCULAR | Status: AC
  Administered 2013-11-02: 1 mg via INTRAVENOUS
  Filled 2013-11-02: qty 1

## 2013-11-02 MED ORDER — CYCLOBENZAPRINE HCL 10 MG PO TABS: 10.0000 mg | ORAL_TABLET | Freq: Two times a day (BID) | ORAL | Status: DC | PRN

## 2013-11-02 MED ORDER — MORPHINE SULFATE 4 MG/ML IJ SOLN
4.0000 mg | Freq: Once | INTRAMUSCULAR | Status: AC
  Administered 2013-11-02: 4 mg via INTRAVENOUS
  Filled 2013-11-02: qty 1

## 2013-11-02 MED ORDER — ONDANSETRON HCL 4 MG/2ML IJ SOLN
4.0000 mg | Freq: Once | INTRAMUSCULAR | Status: AC
  Administered 2013-11-02: 4 mg via INTRAVENOUS
  Filled 2013-11-02: qty 2

## 2013-11-02 MED ORDER — IOHEXOL 300 MG/ML  SOLN
100.0000 mL | Freq: Once | INTRAMUSCULAR | Status: AC | PRN
  Administered 2013-11-02: 100 mL via INTRAVENOUS

## 2013-11-02 NOTE — ED Provider Notes (Signed)
CSN: [id number]     Arrival date & time 11/02/13  0711 History  This chart was scribed for Gilda Creasehristopher J. Pollina, MD by Quintella ReichertMatthew Underwood, ED scribe.  This patient was seen in room APA04/APA04 and the patient's care was started at 7:18 AM.   Chief Complaint  Patient presents with  . Flank Pain    The history is provided by the patient. No language interpreter was used.    HPI Comments: Harry Smith is a 38 y.o. male with h/o DM who presents to the Emergency Department complaining of progressively-worsening left flank pain that began 2 days ago.  Pt reports his pain radiates around into his LLQ abdomen.  It  is worsened by riding in a vehicle.  He also reports associated nausea but denies vomiting, diarrhea or changes in urinary symptoms.  He reports he had kidney stones on his right side but his pain was less severe then.  He also has h/o cholecystectomy and appendectomy.   Past Medical History  Diagnosis Date  . Diabetes mellitus without complication     Past Surgical History  Procedure Laterality Date  . Back surgery    . Cholecystectomy    . Appendectomy      History reviewed. No pertinent family history.   History  Substance Use Topics  . Smoking status: Never Smoker   . Smokeless tobacco: Not on file  . Alcohol Use: No     Review of Systems  Gastrointestinal: Positive for nausea. Negative for vomiting and diarrhea.  Genitourinary: Positive for flank pain. Negative for dysuria, hematuria and difficulty urinating.      Allergies  Review of patient's allergies indicates no known allergies.  Home Medications   Prior to Admission medications   Medication Sig Start Date End Date Taking? Authorizing Provider  glyBURIDE (DIABETA) 5 MG tablet Take 5 mg by mouth daily.    Historical Provider, MD  HYDROcodone-acetaminophen (NORCO/VICODIN) 5-325 MG per tablet Take 2 tablets by mouth every 4 (four) hours as needed for moderate pain. 08/18/13   Gilda Creasehristopher J. Pollina, MD   metFORMIN (GLUCOPHAGE) 1000 MG tablet Take 1,000 mg by mouth 2 (two) times daily.    Historical Provider, MD   BP 180/100  Pulse 96  Temp(Src) 98.1 F (36.7 C) (Oral)  SpO2 100%  Physical Exam  Constitutional: He is oriented to person, place, and time. He appears well-developed and well-nourished. No distress.  HENT:  Head: Normocephalic and atraumatic.  Right Ear: Hearing normal.  Left Ear: Hearing normal.  Nose: Nose normal.  Mouth/Throat: Oropharynx is clear and moist and mucous membranes are normal.  Eyes: Conjunctivae and EOM are normal. Pupils are equal, round, and reactive to light.  Neck: Normal range of motion. Neck supple.  Cardiovascular: Regular rhythm, S1 normal and S2 normal.  Exam reveals no gallop and no friction rub.   No murmur heard. Pulmonary/Chest: Effort normal and breath sounds normal. No respiratory distress. He exhibits no tenderness.  Abdominal: Soft. Normal appearance and bowel sounds are normal. There is no hepatosplenomegaly. There is tenderness in the left lower quadrant. There is no rebound, no guarding, no CVA tenderness, no tenderness at McBurney's point and negative Murphy's sign. No hernia.  Musculoskeletal: Normal range of motion.  Neurological: He is alert and oriented to person, place, and time. He has normal strength. No cranial nerve deficit or sensory deficit. Coordination normal. GCS eye subscore is 4. GCS verbal subscore is 5. GCS motor subscore is 6.  Skin: Skin is warm,  dry and intact. No rash noted. No cyanosis.  Psychiatric: He has a normal mood and affect. His speech is normal and behavior is normal. Thought content normal.    ED Course  Procedures (including critical care time)  DIAGNOSTIC STUDIES: Oxygen Saturation is 100% on room air, normal by my interpretation.    COORDINATION OF CARE: 7:23 AM-Discussed treatment plan which includes pain medication, anti-emetics, CT abdomen, and labs with pt at bedside and pt agreed to plan.      Labs Review Labs Reviewed  BASIC METABOLIC PANEL - Abnormal; Notable for the following:    Sodium 132 (*)    Chloride 92 (*)    Glucose, Bld 516 (*)    All other components within normal limits  URINALYSIS, ROUTINE W REFLEX MICROSCOPIC - Abnormal; Notable for the following:    Glucose, UA >1000 (*)    Hgb urine dipstick TRACE (*)    All other components within normal limits  CBC WITH DIFFERENTIAL  URINE MICROSCOPIC-ADD ON    Imaging Review Ct Abdomen Pelvis W Contrast  11/02/2013   CLINICAL DATA:  Left lower quadrant pain.  EXAM: CT ABDOMEN AND PELVIS WITH CONTRAST  TECHNIQUE: Multidetector CT imaging of the abdomen and pelvis was performed using the standard protocol following bolus administration of intravenous contrast.  CONTRAST:  50mL OMNIPAQUE IOHEXOL 300 MG/ML SOLN, 100mL OMNIPAQUE IOHEXOL 300 MG/ML SOLN  COMPARISON:  None.  FINDINGS: Visualized lung bases are clear.  There is no pleural effusion.  The liver is mildly low in attenuation diffusely, suggestive of mild steatosis. A small calcification is noted in the hepatic dome. The gallbladder is surgically absent. The spleen, adrenal glands, kidneys, and pancreas have an unremarkable enhanced appearance.  Oral contrast is present in multiple loops of small bowel without evidence of obstruction. The colon is nondilated. No gross bowel wall thickening is seen. The appendix is not identified, consistent with prior appendectomy. The bladder is grossly unremarkable for degree of distention. No free fluid or enlarged lymph nodes are identified.  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 Prior posterior lumbar interbody fusion is noted at L4-5. Small Schmorl's nodes are present in the lower thoracic and upper lumbar spine.  IMPRESSION: 1.  No acute abnormality identified in the abdomen or pelvis. 2. Mild hepatic steatosis.   Electronically Signed   By: Sebastian AcheAllen  Grady   On: 11/02/2013 09:38      EKG Interpretation None      MDM   Final diagnoses:  Back pain  Abdominal pain    Patient presents to ER with complaints of left flank pain. Pain initially was in the back, now down at the left lower abdomen. Pain is worse with movement. Differential diagnoses include musculoskeletal back pain, kidney infection, kidney stone, diverticulitis. The lab work was normal. Urinalysis was normal. CT scan with contrast was performed to further evaluate. No hydroureter, ureterolithiasis, diverticulitis or inflammatory process. The patient will be treated with analgesia and rest.   I personally performed the services described in this documentation, which was scribed in my presence. The recorded information has been reviewed and is accurate.    Gilda Creasehristopher J. Pollina, MD 11/02/13 1007

## 2013-11-02 NOTE — ED Notes (Signed)
Pt c/o left flank pain that radiates to LLQ since Thursday. Pt denies changes in urinary symptoms. Describes pain as sharp. Pt also reports nausea but denies v/d.

## 2013-11-02 NOTE — Discharge Instructions (Signed)
Abdominal Pain, Adult °Many things can cause abdominal pain. Usually, abdominal pain is not caused by a disease and will improve without treatment. It can often be observed and treated at home. Your health care provider will do a physical exam and possibly order blood tests and X-rays to help determine the seriousness of your pain. However, in many cases, more time must pass before a clear cause of the pain can be found. Before that point, your health care provider may not know if you need more testing or further treatment. °HOME CARE INSTRUCTIONS  °Monitor your abdominal pain for any changes. The following actions may help to alleviate any discomfort you are experiencing: °· Only take over-the-counter or prescription medicines as directed by your health care provider. °· Do not take laxatives unless directed to do so by your health care provider. °· Try a clear liquid diet (broth, tea, or water) as directed by your health care provider. Slowly move to a bland diet as tolerated. °SEEK MEDICAL CARE IF: °· You have unexplained abdominal pain. °· You have abdominal pain associated with nausea or diarrhea. °· You have pain when you urinate or have a bowel movement. °· You experience abdominal pain that wakes you in the night. °· You have abdominal pain that is worsened or improved by eating food. °· You have abdominal pain that is worsened with eating fatty foods. °SEEK IMMEDIATE MEDICAL CARE IF:  °· Your pain does not go away within 2 hours. °· You have a fever. °· You keep throwing up (vomiting). °· Your pain is felt only in portions of the abdomen, such as the right side or the left lower portion of the abdomen. °· You pass bloody or black tarry stools. °MAKE SURE YOU: °· Understand these instructions.   °· Will watch your condition.   °· Will get help right away if you are not doing well or get worse.   °Document Released: 03/30/2005 Document Revised: 04/10/2013 Document Reviewed: 02/27/2013 °Exit[employer]® Patient  Information ©2014 [employer]. ° ° ° ° °Back Pain, Adult °Low back pain is very common. About 1 in 5 people have back pain. The cause of low back pain is rarely dangerous. The pain often gets better over time. About half of people with a sudden onset of back pain feel better in just 2 weeks. About 8 in 10 people feel better by 6 weeks.  °CAUSES °Some common causes of back pain include: °· Strain of the muscles or ligaments supporting the spine. °· Wear and tear (degeneration) of the spinal discs. °· Arthritis. °· Direct injury to the back. °DIAGNOSIS °Most of the time, the direct cause of low back pain is not known. However, back pain can be treated effectively even when the exact cause of the pain is unknown. Answering your caregiver's questions about your overall health and symptoms is one of the most accurate ways to make sure the cause of your pain is not dangerous. If your caregiver needs more information, he or she may order lab work or imaging tests (X-rays or MRIs). However, even if imaging tests show changes in your back, this usually does not require surgery. °HOME CARE INSTRUCTIONS °For many people, back pain returns. Since low back pain is rarely dangerous, it is often a condition that people can learn to manage on their own.  °· Remain active. It is stressful on the back to sit or stand in one place. Do not sit, drive, or stand in one place for more than 30 minutes at a time. Take short walks on level surfaces   as soon as pain allows. Try to increase the length of time you walk each day. °· Do not stay in bed. Resting more than 1 or 2 days can delay your recovery. °· Do not avoid exercise or work. Your body is made to move. It is not dangerous to be active, even though your back may hurt. Your back will likely heal faster if you return to being active before your pain is gone. °· Pay attention to your body when you  bend and lift. Many people have less discomfort when lifting if they bend their knees,  keep the load close to their bodies, and avoid twisting. Often, the most comfortable positions are those that put less stress on your recovering back. °· Find a comfortable position to sleep. Use a firm mattress and lie on your side with your knees slightly bent. If you lie on your back, put a pillow under your knees. °· Only take over-the-counter or prescription medicines as directed by your caregiver. Over-the-counter medicines to reduce pain and inflammation are often the most helpful. Your caregiver may prescribe muscle relaxant drugs. These medicines help dull your pain so you can more quickly return to your normal activities and healthy exercise. °· Put ice on the injured area. °· Put ice in a plastic bag. °· Place a towel between your skin and the bag. °· Leave the ice on for 15-20 minutes, 03-04 times a day for the first 2 to 3 days. After that, ice and heat may be alternated to reduce pain and spasms. °· Ask your caregiver about trying back exercises and gentle massage. This may be of some benefit. °· Avoid feeling anxious or stressed. Stress increases muscle tension and can worsen back pain. It is important to recognize when you are anxious or stressed and learn ways to manage it. Exercise is a great option. °SEEK MEDICAL CARE IF: °· You have pain that is not relieved with rest or medicine. °· You have pain that does not improve in 1 week. °· You have new symptoms. °· You are generally not feeling well. °SEEK IMMEDIATE MEDICAL CARE IF:  °· You have pain that radiates from your back into your legs. °· You develop new bowel or bladder control problems. °· You have unusual weakness or numbness in your arms or legs. °· You develop nausea or vomiting. °· You develop abdominal pain. °· You feel faint. °Document Released: 06/20/2005 Document Revised: 12/20/2011 Document Reviewed: 11/08/2010 °ExitCare® Patient Information ©2014 [employer]. ° °

## 2013-12-11 ENCOUNTER — Ambulatory Visit (INDEPENDENT_AMBULATORY_CARE_PROVIDER_SITE_OTHER): Payer: Self-pay | Admitting: Internal Medicine

## 2014-02-19 ENCOUNTER — Emergency Department (HOSPITAL_COMMUNITY)
Admission: EM | Admit: 2014-02-19 | Discharge: 2014-02-19 | Discharge status: Against Medical Advice | Payer: Medicaid - Out of State

## 2014-02-21 ENCOUNTER — Emergency Department: Payer: Self-pay

## 2014-02-21 ENCOUNTER — Emergency Department
Admission: EM | Admit: 2014-02-21 | Discharge: 2014-02-21 | Disposition: A | Discharge status: Home / Self Care | Payer: Self-pay | Attending: Emergency Medicine | Admitting: Emergency Medicine

## 2014-02-21 DIAGNOSIS — Z87891 Personal history of nicotine dependence: Secondary | ICD-10-CM | POA: Insufficient documentation

## 2014-02-21 DIAGNOSIS — N23 Unspecified renal colic: Secondary | ICD-10-CM | POA: Insufficient documentation

## 2014-02-21 DIAGNOSIS — N200 Calculus of kidney: Secondary | ICD-10-CM | POA: Insufficient documentation

## 2014-02-21 DIAGNOSIS — E119 Type 2 diabetes mellitus without complications: Secondary | ICD-10-CM | POA: Insufficient documentation

## 2014-02-21 LAB — COMPREHENSIVE METABOLIC PANEL
ALT: 37 U/L (ref 0–55)
AST (SGOT): 22 U/L (ref 10–42)
Albumin/Globulin Ratio: 1.18 Ratio (ref 0.70–1.50)
Albumin: 4.3 gm/dL (ref 3.5–5.0)
Alkaline Phosphatase: 59 U/L (ref 40–145)
Anion Gap: 18.1 mMol/L — ABNORMAL HIGH (ref 7.0–18.0)
BUN / Creatinine Ratio: 15.3 Ratio (ref 10.0–30.0)
BUN: 18 mg/dL (ref 7–22)
Bilirubin, Total: 0.4 mg/dL (ref 0.1–1.2)
CO2: 21.9 mMol/L (ref 20.0–30.0)
Calcium: 10.1 mg/dL (ref 8.5–10.5)
Chloride: 99 mMol/L (ref 98–110)
Creatinine: 1.18 mg/dL (ref 0.80–1.30)
EGFR: >60 mL/min/{1.73_m2}
Globulin: 3.6 gm/dL (ref 2.0–4.0)
Glucose: 319 mg/dL — ABNORMAL HIGH (ref 70–99)
Osmolality Calc: 284 mOsm/kg (ref 275–300)
Potassium: 4.2 mMol/L (ref 3.5–5.3)
Protein, Total: 7.9 gm/dL (ref 6.0–8.3)
Sodium: 135 mMol/L — ABNORMAL LOW (ref 136–147)

## 2014-02-21 LAB — CBC AND DIFFERENTIAL
Basophils %: 0.8 % (ref 0.0–3.0)
Basophils Absolute: 0.1 10*3/uL (ref 0.0–0.3)
Eosinophils %: 2.4 % (ref 0.0–7.0)
Eosinophils Absolute: 0.2 10*3/uL (ref 0.0–0.8)
Hematocrit: 47.1 % (ref 39.0–52.5)
Hemoglobin: 15.1 gm/dL (ref 13.0–17.5)
Lymphocytes Absolute: 1 10*3/uL (ref 0.6–5.1)
Lymphocytes: 12.7 % — ABNORMAL LOW (ref 15.0–46.0)
MCH: 29 pg (ref 28–35)
MCHC: 32 gm/dL (ref 31–36)
MCV: 91 fL (ref 80–100)
MPV: 7.2 fL (ref 6.0–10.0)
Monocytes Absolute: 0.6 10*3/uL (ref 0.1–1.7)
Monocytes: 7.1 % (ref 3.0–15.0)
Neutrophils %: 77.1 % (ref 42.0–78.0)
Neutrophils Absolute: 6.4 10*3/uL (ref 1.7–8.6)
PLT CT: 293 10*3/uL (ref 130–440)
RBC: 5.19 10*6/uL (ref 4.00–5.70)
RDW: 12.6 % (ref 10.5–14.5)
WBC: 8.3 10*3/uL (ref 4.00–11.00)

## 2014-02-21 LAB — URINALYSIS
Bilirubin, UA: NEGATIVE mg/dL
Glucose, UA: >=1000 mg/dL — AB
Leukocyte Esterase, UA: NEGATIVE Leu/uL
Nitrite, UA: NEGATIVE
Protein, UR: NEGATIVE mg/dL
Urine Specific Gravity: 1.02 (ref 1.001–1.040)
Urobilinogen, UA: 0.2 mg/dL
WBC, UA: NONE SEEN /hpf
pH, Urine: 5.5 pH (ref 5.0–8.0)

## 2014-02-21 LAB — C-REACTIVE PROTEIN: C-Reactive Protein: 0.35 mg/dL (ref 0.02–0.80)

## 2014-02-21 LAB — LACTIC ACID, PLASMA: Lactic Acid: 1.68 mMol/L (ref 0.50–2.10)

## 2014-02-21 LAB — SEDIMENTATION RATE: Sed Rate: 24 mm/hr — ABNORMAL HIGH (ref 0–20)

## 2014-02-21 MED ORDER — ONDANSETRON HCL 4 MG/2ML IJ SOLN
4.00 mg | Freq: Once | INTRAMUSCULAR | Status: AC
Start: 2014-02-21 — End: 2014-02-21
  Administered 2014-02-21: 4 mg via INTRAVENOUS

## 2014-02-21 MED ORDER — VH HYDROMORPHONE HCL PF 1 MG/ML CARPUJECT
1.0000 mg | Freq: Once | INTRAMUSCULAR | Status: AC
Start: 2014-02-21 — End: 2014-02-21
  Administered 2014-02-21: 1 mg via INTRAVENOUS

## 2014-02-21 MED ORDER — TAMSULOSIN HCL 0.4 MG PO CAPS
0.4000 mg | ORAL_CAPSULE | ORAL | Status: AC
Start: 2014-02-21 — End: ?

## 2014-02-21 MED ORDER — MELOXICAM 7.5 MG PO TABS
15.0000 mg | ORAL_TABLET | Freq: Every day | ORAL | Status: AC
Start: 2014-02-21 — End: 2014-03-03

## 2014-02-21 MED ORDER — TAMSULOSIN HCL 0.4 MG PO CAPS
ORAL_CAPSULE | ORAL | Status: AC
Start: 2014-02-21 — End: ?
  Filled 2014-02-21: qty 1

## 2014-02-21 MED ORDER — VH HYDROMORPHONE HCL PF 1 MG/ML CARPUJECT
INTRAMUSCULAR | Status: AC
Start: 2014-02-21 — End: ?
  Filled 2014-02-21: qty 1

## 2014-02-21 MED ORDER — TAMSULOSIN HCL 0.4 MG PO CAPS
0.4000 mg | ORAL_CAPSULE | Freq: Once | ORAL | Status: DC
Start: 2014-02-21 — End: 2014-02-21

## 2014-02-21 MED ORDER — MORPHINE SULFATE 4 MG/ML IJ/IV SOLN (WRAP)
4.0000 mg | Freq: Once | Status: DC
Start: 2014-02-21 — End: 2014-02-21

## 2014-02-21 MED ORDER — ONDANSETRON HCL 4 MG/2ML IJ SOLN
INTRAMUSCULAR | Status: AC
Start: 2014-02-21 — End: ?
  Filled 2014-02-21: qty 2

## 2014-02-21 MED ORDER — TAMSULOSIN HCL 0.4 MG PO CAPS
0.4000 mg | ORAL_CAPSULE | ORAL | Status: DC
Start: 2014-02-21 — End: 2014-02-21
  Administered 2014-02-21: 0.4 mg via ORAL

## 2014-02-21 NOTE — ED Provider Notes (Signed)
[facility] EMERGENCY DEPARTMENT  History and Physical Exam        Patient: Henry Stephens, Henry Stephens  Encounter Date: 02/21/2014    DOB: 05/02/1976  Age/Sex: 38 y.o. male     MRN:  [id number]  Room: E7/ED7-A    PCP: Christa See, MD  Attending:Prudy Candy, Shail Urbas MD      ARRIVAL MODE: LIMITED BY GIVEN BY    pvt  none    patient     H&P ROS   1. Location: rt flank  2. Duration: few days  3. Severity: moderate  4. Quality: dull  5. Timing: constant  6. Aggravated With: urination  7. Alleviated: With: none  8. Associated Symptoms: groin pain   [name] is a 38 y.o. male who presents with Rt flank pain radiating to the groin, similar to previous history of kideny stones   All other systems reviewed and are negative     ALL / MEDS / PMH / PSH / PFH / SH   Allergies Past Medical Hx Past Surgical Hx   Pt is allergic to lortab; morphine; tramadol; and toradol. Pt has a past medical history of Diabetes mellitus and Kidney stones. Pt has past surgical history that includes Back surgery; Appendectomy; and Cholecystectomy.    Social History Family History    Pt reports that he has quit smoking. He does not have any smokeless tobacco history on file. He reports that he does not drink alcohol or use illicit drugs. The family history is not on file.     Medications   Current Outpatient Rx   Name  Route  Sig  Dispense  Refill   . Dapagliflozin Propanediol (FARXIGA) 10 MG Tab    Oral    Take by mouth.             Marland Kitchen glipiZIDE (GLUCOTROL) 10 MG tablet    Oral    Take 20 mg by mouth 2 (two) times daily before meals.             Marland Kitchen glyBURIDE (DIABETA) 5 MG tablet    Oral    Take 10 mg by mouth 2 (two) times daily with meals.             . meloxicam (MOBIC) 7.5 MG tablet    Oral    Take 2 tablets (15 mg total) by mouth daily.    20 tablet    0     . predniSONE (DELTASONE) 20 MG tablet        Take 3 tablets daily for 5 days    15 tablet    0     . tamsulosin (FLOMAX) 0.4 MG Cap    Oral    Take 1 capsule (0.4 mg total) by mouth after  breakfast.    30 capsule    0              Physical Exam     Constitutional: Patient is in mild discomfort.   Eyes: Pupils equal. EOMI.   ENMT: NL appearance of ears/nose. MMM.   Respiratory: Lungs are clear bilaterally. No work of breathing.   Cardiovascular: Heart regular rate and rhythm. No leg edema bilaterally.   GI/GU: Abdomen is soft, nontender; no guarding or rigidity.   Skin: Skin is warm and dry.   Neurologic: Awake and alert. Memory intact.   Psychiatric: Normal affect and insight; no agitation.   Neck Neck is supple. Trachea is midline.   Back Left CVA  ED Course   Blood pressure 146/75, pulse 80, temperature 98.2 F (36.8 C), resp. rate 16, height 1.88 m, weight 111.131 kg, SpO2 94 %.  Plan  DDx     Orders Placed This Encounter   Procedures   . Blood Culture - Venipuncture #1   . CT Abdomen Pelvis dry  (Stone)   . Urinalysis   . CBC   . CMP   . Lactic acid   . ESR   . CRP   . Saline lock IV    The differential diagnosis includes, but is not limited to FX, SCIATICA, MUSCULOSKELETAL, HNP, SPINAL STENOSIS, AAA, KIDNEY STONE, TUMOR, CORD COMPRESSION (i.e. IVC thrombosis), CAUDA EQUINA SYNDROME, INFECTION (i.e. epidural abscess, pyelonephritis), MALIGNANCY                 Diagnostic Results   The results of the diagnostic studies below have been reviewed by myself     Labs  Results     Procedure Component Value Units Date/Time    CRP [[id number]] Collected:  02/21/14 1521    Specimen Information:  Blood / Plasma Updated:  02/21/14 1729    ESR [[id number]]  (Abnormal) Collected:  02/21/14 1521    Specimen Information:  Blood / Blood Updated:  02/21/14 1621     Sed Rate 24 (H) mm/hr     CMP [[id number]]  (Abnormal) Collected:  02/21/14 1521    Specimen Information:  Blood / Plasma Updated:  02/21/14 1559     Sodium 135 (L) mMol/L      Potassium 4.2 mMol/L      Chloride 99 mMol/L      CO2 21.9 mMol/L      CALCIUM 10.1 mg/dL      Glucose 956 (H) mg/dL      Creatinine 3.87 mg/dL      BUN 18 mg/dL       Protein, Total 7.9 gm/dL      Albumin 4.3 gm/dL      Alkaline Phosphatase 59 U/L      ALT 37 U/L      AST (SGOT) 22 U/L      Bilirubin, Total 0.4 mg/dL      Albumin/Globulin Ratio 1.18 Ratio      Anion Gap 18.1 (H) mMol/L      BUN/Creatinine Ratio 15.3 Ratio      EGFR >60 mL/min/1.80m2      Osmolality Calc 284 mOsm/kg      Globulin 3.6 gm/dL     Lactic acid [[id number]] Collected:  02/21/14 1521    Specimen Information:  Blood / Blood Updated:  02/21/14 1555     Lactic acid 1.68 mMol/L     CBC [[id number]]  (Abnormal) Collected:  02/21/14 1521    Specimen Information:  Blood / Blood Updated:  02/21/14 1541     WBC 8.3 K/cmm      RBC 5.19 M/cmm      Hemoglobin 15.1 gm/dL      Hematocrit 01.6 %      MCV 91 fL      MCH 29 pg      MCHC 32 gm/dL      RDW 01.0 %      PLT CT 293 K/cmm      MPV 7.2 fL      NEUTROPHIL % 77.1 %      Lymphocytes 12.7 (L) %      Monocytes 7.1 %      Eosinophils % 2.4 %  Basophils % 0.8 %      Neutrophils Absolute 6.4 K/cmm      Lymphocytes Absolute 1.0 K/cmm      Monocytes Absolute 0.6 K/cmm      Eosinophils Absolute 0.2 K/cmm      BASO Absolute 0.1 K/cmm     Blood Culture - Venipuncture #1 [[id number]] Collected:  02/21/14 1521    Specimen Information:  Blood Updated:  02/21/14 1527    Urinalysis [[id number]]  (Abnormal) Collected:  02/21/14 1450    Specimen Information:  Urine / Urine, Random Updated:  02/21/14 1503     Color, UA Yellow      Clarity, UA Slightly Cloudy (A)      Specific Gravity, UR 1.020      pH, Urine 5.5 pH      Protein, UR Negative mg/dL      Glucose, UA >=7829 (A) mg/dL      Ketones UA Trace (A) mg/dL      Bilirubin, UA Negative mg/dL      Blood, UA Large (A) mg/dL      Nitrite, UA Negative      Urobilinogen, UA 0.2 mg/dL      Leukocyte Esterase, UA Negative Leu/uL      WBC, UA None Seen /hpf      RBC, UA TNTC (A) /hpf      Squam Epithel, UA 1-5 /lpf         IMAGING  Radiology Results (24 Hour)     Procedure Component Value Units Date/Time    CT Abdomen Pelvis dry   Larina Bras) [[id number]] Collected:  02/21/14 1649    Order Status:  Completed Updated:  02/21/14 1703    Narrative:      Clinical History:  Left flank pain radiating to the groin for 2 days.    Examination:  CT abdomen and pelvis without contrast.    Technique:  3 mm helical images obtained from the dome of the diaphragms to the pubic symphysis without IV contrast. Sagittal and  coronal reformatted images provided.    Comparison:  August 07, 2012 abdominal and pelvic CT without contrast.    Findings:  The exam is technically limited by lack of intravenous contrast.    Lower chest:  Lung bases clear. No focal consolidation or pleural effusion.    Abdomen:  Diffuse decreased attenuation of the liver likely due to fatty infiltration. Liver measures 20 cm from superior to  inferior midclavicular line. Cholecystectomy clips.    Pancreas, spleen, and adrenal glands are normal.    No renal stones or hydronephrosis. No perinephric stranding about the kidneys.    Normal caliber abdominal aorta and IVC.    Normal caliber stomach and duodenum. Mild dilatation of the proximal jejunum measuring 4 cm in diameter likely due to  peristalsis. Small bowel loops are otherwise normal in caliber.    Scattered normal size mesenteric and retroperitoneal lymph nodes unchanged since August 07, 2012.    Pelvis:  Possible punctate sand-like stone in the left ureter at level of the iliac vessels (series 3, image 121). No ureteral  dilatation or periureteral stranding.    Urinary bladder normal. Normal prostate.    Postoperative changes appendectomy. Scattered gas and stool throughout normal caliber colon and rectum. No bowel wall  thickening or pericolonic inflammation.    No pelvic lymphadenopathy.    Bones and Soft Tissues:  Postoperative changes L4-5 posterior fusion with bilateral pedicle screws and posterior rod fixation, decompressive  laminectomy. L4-5 intervertebral disc  space devices with irregularity of the adjacent endplates and  chronic Grade I  anterolisthesis of L4 and L5 unchanged since August 07, 2012.      Impression:      1.  Possible nonobstructing punctate sand-like stone lower left ureter at level of the iliac vessels.  2.  Kidneys, ureters, bladder otherwise normal.  3.  Hepatic steatosis and hepatomegaly.  4.  Postoperative changes cholecystectomy and L4-5 fusion.    ReadingStation:[id number]        Procedures        ED Progress/Reassessment      Medications Given in ED E7-ED7-A - MAR ACTION REPORT  (last 24 hrs)         RICKARD, Beverly Gust, RN       Medication Name Action Time Action Site Route Rate Dose Reason Comments User     HYDROmorphone (DILAUDID) injection 1 mg 02/21/14 1646 Given  Intravenous  1 mg   Thomes Cake, RN          Artelia Laroche, RN       Medication Name Action Time Action Site Route Rate Dose Reason Comments User     HYDROmorphone (DILAUDID) injection 1 mg 02/21/14 1719 Given  Intravenous  1 mg   Artelia Laroche, RN     ondansetron [facility]) injection 4 mg 02/21/14 1538 Given  Intravenous  4 mg   Artelia Laroche, RN               Progress/Reassessment Responded well to Dilaudid    Consultation Patient/Family Counsling    Labs / imaging were reviewed and explained to the patient. All questions have been answered. Pt is appreciative of care. Discussed at length with family.     MDM at Disposition (.X)   The patient presents with flank pain without signs of peritonitis or other life-threatening or serious etiology. Intra-abdominal, cardiac, and pulmonary etiologies were considered and rejected.  No rash.  The patient appears stable for discharge and has been instructed to return immediately if the symptoms worsen in any way, or in 8-12hr if not improved for re-evaluation. The patient has been instructed to return immediately if the symptoms worsen or change in any way.   Diagnostic impression and plan were discussed with the patient and/or family.  If ordered the results of lab/radiology tests were discussed  with the patient and/or family.  All questions were answered and concerns addressed.        Clinical Impression   1. Kidney stones    2. Renal colic      Disposition  ED Disposition     Discharge [facility] Robarge discharge to home/self care.    Condition at disposition: Stable          Prescriptions  New Prescriptions    MELOXICAM (MOBIC) 7.5 MG TABLET    Take 2 tablets (15 mg total) by mouth daily.    TAMSULOSIN (FLOMAX) 0.4 MG CAP    Take 1 capsule (0.4 mg total) by mouth after breakfast.     Cresenciano Lick, MD  5:20 PM  Attestation   In addition to the above history, please see nursing notes.  Allergies, meds, past medical, family, social hx, and the results of the diagnostic studies performed have been reviewed.  This is note has been created using an EMR that may contain additions or subtractions not intended by myself, Loel Lofty MD.          Cresenciano Lick, MD  02/21/14 1733

## 2014-02-21 NOTE — ED Notes (Signed)
See quick triage

## 2014-02-21 NOTE — ED Notes (Signed)
Pt in ct scan , unable to lay still for ct , Jimmy RN to ct to medicate pt with dilaudid per phys order,

## 2014-02-21 NOTE — ED Notes (Signed)
MD at bedside. 

## 2014-02-21 NOTE — Discharge Instructions (Signed)
Kidney Stone (W/ Colic)    The sharp cramping pain and nausea/vomiting that you have is due to a small stone which has formed in the kidney and is now passing down a narrow tube (ureter) on its way to your bladder. Once it reaches your bladder, the pain will stop. The stone may pass in your urine stream in one piece. [The size may be 1/16" to 1/4" (1-6mm)]. Or, the stone may also break up into sandy fragments which you may not even notice.  Once you have had a kidney stone, you are at risk for developing another one in the future.  Home Care:   Drink plenty of fluids (at least 8 to 10 glasses of water a day).   Most stones will pass on their own, but may take from a few hours to a few days. Sometimes the stone is too large to pass by itself and special methods will have to be used to remove the stone.   Each time you urinate, do so in a jar. Pour the urine from the jar through the strainer and into the toilet. Continue doing this until 24 hours after your pain stops. By then, if there was a kidney stone, it should pass from your bladder. Some stones dissolve into sand-like particles and pass right through the strainer. In that case, you won't ever see a stone.   Save any stone that you find in the strainer and bring it to your doctor for analysis. It may be possible to prevent certain types of stones from forming. Therefore, it is important to know what kind of stone you have.   Try to stay as active as possible since this will help the stone pass. Do not stay in bed unless your pain prevents you from getting up. You may notice a red, pink or brown color to your urine. This is normal while passing a kidney stone.  Follow Up  with your doctor or return to this facility if the pain lasts more than 48 hours.  Get Prompt Medical Attention  if any of the following occur:   Pain that is not controlled by the medicine given   Repeated vomiting or unable to keep down fluids   Weakness, dizziness or  fainting   Fever of 100.4F (38C) or higher, or as directed by your healthcare provider   Passage of solid red or brown urine (can't see through it) or urine with lots of blood clots   Unable to pass urine for 8 hours and increasing bladder pressure   2000-2014 [employer] [address], [place] [address]. All rights reserved. This information is not intended as a substitute for professional medical care. Always follow your healthcare professional's instructions.

## 2014-02-21 NOTE — ED Notes (Addendum)
Pt discharged with rx and instructions . Pt advised me that he came by taxi, pt left and went to parking lot and drove himself away ina white car after getting 2 mg of dilaudid iv .

## 2014-02-21 NOTE — ED Notes (Signed)
Pt lying on cot texting on phone, minimal distress noted,

## 2014-02-21 NOTE — ED Notes (Signed)
Pt states started having left sided pain yesterday , saw his pcp , had a cardiac work up that was negative, today pain returned lower to left side radiating to groin. +n/-v , constant , sharp , rating pain 8/10 the patient appears somewhat uncomfortable.

## 2014-03-03 LAB — D-DIMER, QUANTITATIVE: D-Dimer, Quant: 0.63 mg/L FEU (ref 0.00–0.65)

## 2014-03-03 LAB — URINALYSIS W/ REFLEX CULTURE
Bacteria: NEGATIVE /hpf
Bilirubin: NEGATIVE
Blood: NEGATIVE
Glucose: >1000 mg/dL — AB
Ketone: NEGATIVE mg/dL
Leukocyte Esterase: NEGATIVE
Nitrites: NEGATIVE
Protein: NEGATIVE mg/dL
Specific gravity: >1.03 — ABNORMAL HIGH (ref 1.003–1.030)
Urobilinogen: 0.2 EU/dL (ref 0.2–1.0)
pH (UA): 6.5 (ref 5.0–8.0)

## 2014-03-03 LAB — MAGNESIUM: Magnesium: 1.7 mg/dL (ref 1.6–2.4)

## 2014-03-03 LAB — CBC WITH AUTOMATED DIFF
ABS. BASOPHILS: 0 10*3/uL (ref 0.0–0.1)
ABS. EOSINOPHILS: 0.4 10*3/uL (ref 0.0–0.4)
ABS. LYMPHOCYTES: 1.2 10*3/uL
ABS. MONOCYTES: 0.7 10*3/uL (ref 0.0–1.0)
ABS. NEUTROPHILS: 6.6 10*3/uL (ref 1.8–8.0)
BASOPHILS: 0 % (ref 0–1)
EOSINOPHILS: 5 % (ref 0–7)
HCT: 40.9 % (ref 36.6–50.3)
HGB: 13.6 g/dL (ref 12.1–17.0)
LYMPHOCYTES: 14 % (ref 12–49)
MCH: 29.9 PG (ref 26.0–34.0)
MCHC: 33.3 g/dL (ref 30.0–36.5)
MCV: 89.9 FL (ref 80.0–99.0)
MONOCYTES: 8 % (ref 5–13)
NEUTROPHILS: 73 % (ref 32–75)
PLATELET: 292 10*3/uL (ref 150–400)
RBC: 4.55 M/uL (ref 4.10–5.70)
RDW: 13 % (ref 11.5–14.5)
WBC: 9 10*3/uL (ref 4.1–11.1)

## 2014-03-03 LAB — METABOLIC PANEL, COMPREHENSIVE
A-G Ratio: 1.1 (ref 1.1–2.2)
ALT (SGPT): 31 U/L (ref 12–78)
AST (SGOT): 14 U/L — ABNORMAL LOW (ref 15–37)
Albumin: 3.8 g/dL (ref 3.5–5.0)
Alk. phosphatase: 66 U/L (ref 45–117)
Anion gap: 7 mmol/L (ref 5–15)
BUN/Creatinine ratio: 13 (ref 12–20)
BUN: 13 MG/DL (ref 6–20)
Bilirubin, total: 0.3 MG/DL (ref 0.2–1.0)
CO2: 29 mmol/L (ref 21–32)
Calcium: 9.1 MG/DL (ref 8.5–10.1)
Chloride: 97 mmol/L (ref 97–108)
Creatinine: 0.97 MG/DL (ref 0.45–1.15)
GFR est AA: >60 mL/min/{1.73_m2} (ref >60)
GFR est non-AA: >60 mL/min/{1.73_m2} (ref >60)
Globulin: 3.4 g/dL (ref 2.0–4.0)
Glucose: 241 mg/dL — ABNORMAL HIGH (ref 65–100)
Potassium: 3.9 mmol/L (ref 3.5–5.1)
Protein, total: 7.2 g/dL (ref 6.4–8.2)
Sodium: 133 mmol/L — ABNORMAL LOW (ref 136–145)

## 2014-03-03 LAB — CK W/ CKMB & INDEX
CK - MB: 3.5 NG/ML (ref 0.5–3.6)
CK-MB Index: 1.9 (ref 0–2.5)
CK: 189 U/L (ref 39–308)

## 2014-03-03 LAB — DRUG SCREEN, URINE
AMPHETAMINES: NEGATIVE
BARBITURATES: NEGATIVE
BENZODIAZEPINES: NEGATIVE
COCAINE: NEGATIVE
METHADONE: NEGATIVE
OPIATES: NEGATIVE
PCP(PHENCYCLIDINE): NEGATIVE
THC (TH-CANNABINOL): NEGATIVE

## 2014-03-03 LAB — LIPASE: Lipase: 150 U/L (ref 73–393)

## 2014-03-03 LAB — D DIMER: D-dimer: 0.63 mg/L FEU (ref 0.00–0.65)

## 2014-03-03 LAB — TROPONIN I: Troponin-I, Qt.: 0.04 ng/mL (ref <0.05)

## 2014-03-03 MED ADMIN — acetaminophen (TYLENOL) tablet 650 mg: ORAL | @ 16:00:00 | NDC 00904198280

## 2014-03-03 MED ADMIN — aspirin (ASPIRIN) tablet 325 mg: ORAL | @ 15:00:00 | NDC 51645071601

## 2014-03-03 MED ADMIN — ondansetron (ZOFRAN ODT) tablet 4 mg: ORAL | @ 16:00:00 | NDC 68001024616

## 2014-03-03 MED ADMIN — sodium chloride 0.9 % bolus infusion 1,000 mL: INTRAVENOUS | @ 15:00:00 | NDC 00409798309

## 2014-03-03 MED FILL — ASPIRIN 325 MG TAB: 325 mg | ORAL | Qty: 1

## 2014-03-03 MED FILL — ACETAMINOPHEN 325 MG TABLET: 325 mg | ORAL | Qty: 2

## 2014-03-03 MED FILL — ONDANSETRON 4 MG TAB, RAPID DISSOLVE: 4 mg | ORAL | Qty: 1

## 2014-03-03 MED FILL — SODIUM CHLORIDE 0.9 % IV: INTRAVENOUS | Qty: 1000

## 2014-03-03 NOTE — ED Provider Notes (Signed)
Formatting of this note is different from the original.  Patient is a 38 y.o. male presenting with chest pain.   Chest Pain (Angina)   Pertinent negatives include no abdominal pain, no back pain, no cough, no fever, no headaches, no nausea, no numbness, no shortness of breath, no vomiting and no weakness.   38 yo WM presents with left sided chest pain onset yesterday morning at rest.  Pain 8/10, left chest, nonradiating, constant since onset, sharp.  Pain worse with deep breathing.  Denies nausea, vomiting, sob, cough, fever.  Pt c/o left leg cramping for the past few weeks. Pt is a [employer].  Was seen by PCP yesterday and states he had EKG and blood work done for chest pain and was told it was normal.  Dr. Katrinka Blazing, [place].  Pt states he does not know the doctor's office location or phone number.  Has not had and EKG done at any other facility.  Pt denies ever having any cardiac evaluation.     PMH-DM, appendectomy, cholecystectomy, back surgery  FH-unknown, pt adopted    Past Medical History   Diagnosis Date   ? Diabetes [facility])        Past Surgical History   Procedure Laterality Date   ? Hx orthopaedic       lumbar       History reviewed. No pertinent family history.     History     Social History   ? Marital Status: N/A     Spouse Name: N/A     Number of Children: N/A   ? Years of Education: N/A     Occupational History   ? Not on file.     Social History Main Topics   ? Smoking status: Never Smoker    ? Smokeless tobacco: Not on file   ? Alcohol Use: No   ? Drug Use: No   ? Sexual Activity: Not on file     Other Topics Concern   ? Not on file     Social History Narrative   ? No narrative on file         ALLERGIES: Ketorolac and Morphine    Review of Systems   Constitutional: Negative for fever and chills.   Respiratory: Negative for cough and shortness of breath.    Cardiovascular: Positive for chest pain.   Gastrointestinal: Negative for nausea, vomiting, abdominal pain and diarrhea.   Genitourinary:  Negative for dysuria and hematuria.   Musculoskeletal: Negative for back pain.   Skin: Negative for rash.   Neurological: Negative for weakness, numbness and headaches.   Psychiatric/Behavioral: Negative.    All other systems reviewed and are negative.    Filed Vitals:    03/03/14 0955   BP: 145/112   Pulse: 90   Temp: 99.1 F (37.3 C)   Resp: 20   Height: 6\' 2"  (1.88 m)   Weight: 110.224 kg (243 lb)   SpO2: 99%       Physical Exam   Physical Examination: General appearance - alert, mild distress, oriented to person, place, and time and normal appearing weight  Eyes - pupils equal and reactive, extraocular eye movements intact  Neck - supple, no significant adenopathy  Chest - clear to auscultation, no wheezes, rales or rhonchi, symmetric air entry  Heart - normal rate, regular rhythm, normal S1, S2, no murmurs, rubs, clicks or gallops  Abdomen - soft, nontender, nondistended, no masses or organomegaly  Back exam - full range of  motion, no tenderness, palpable spasm or pain on motion  Neurological - alert, oriented, normal speech, no focal findings or movement disorder noted  Musculoskeletal - no joint tenderness, deformity or swelling  Extremities - peripheral pulses normal, no pedal edema, no clubbing or cyanosis  Skin - normal coloration and turgor, no rashes, no suspicious skin lesions noted  MDM  Number of Diagnoses or Management Options  Acute chest pain:     Amount and/or Complexity of Data Reviewed  Clinical lab tests: ordered and reviewed  Tests in the radiology section of CPT: ordered and reviewed  Decide to obtain previous medical records or to obtain history from someone other than the patient: yes  Review and summarize past medical records: yes  Discuss the patient with other providers: yes (ED physician)  Independent visualization of images, tracings, or specimens: yes    Patient Progress  Patient progress: stable    Procedures    EKG interpretation: (Preliminary)  Rhythm: normal sinus rhythm; and  regular . Rate (approx.): 93; Axis: normal; P wave: normal; QRS interval: normal ; ST/T wave: non-specific changes; q waves in I, aVL, V1, V2, slight st elevation in V2 no changes from EKG 01/06/2014    Pt with extensive narcotic prescriptions from numerous providers.  He states he is not on any other medications other than diabetes medication.  Pt has had 8 narcotic prescriptions filled this month from 8 different facilities and providers. He has had 38 narcotic prescriptions filled this year on PMP from 34 different providers.  Concern for drug seeking behavior.  Pt denies ever having an EKG done, except for yesterday at PCP office (on a Sunday and he doesn't know their location, first name of "Dr. Katrinka Blazing" or his phone number for the office).  Pt had EKG done at [facility] in July, unchanged from today.  VSS, pain for >24 hours with normal CE.      Pt refusing CT scan, signed out AMA.  Electronically signed by Debbe Bales, MD at 03/08/2014  8:27 AM EDT

## 2014-03-03 NOTE — ED Notes (Signed)
Formatting of this note might be different from the original.  2 unsuccesful IV attempts on patient's right hand and left AC.   Electronically signed by Romilda Joy, RN at 03/03/2014 10:03 AM EDT

## 2014-03-03 NOTE — ED Notes (Signed)
Formatting of this note might be different from the original.  Triage note: Patient reports left sided chest pain that began yesterday. Patient saw PCP for complaint yesterday and had en EKG that was normal. Patient states he is still having a lot of pain today. Patient speaking in full sentences with out difficulty.   Electronically signed by Romilda Joy, RN at 03/03/2014  9:50 AM EDT

## 2014-03-03 NOTE — ED Notes (Signed)
Formatting of this note might be different from the original.  Radiology technician brought patient back stating patient was unable to lay down to complete the test. Patient requesting something else for pain, patient was brought back to ER room, and Dr. Leonard Schwartz in to speak with patient.   Electronically signed by Romilda Joy, RN at 03/03/2014 12:24 PM EDT

## 2014-03-03 NOTE — ED Notes (Signed)
Radiology technician brought patient back stating patient was unable to lay down to complete the test. Patient requesting something else for pain, patient was brought back to ER room, and Dr. Leonard Schwartz in to speak with patient.

## 2014-03-03 NOTE — ED Notes (Signed)
2 unsuccesful IV attempts on patient's right hand and left AC.

## 2014-03-03 NOTE — ED Notes (Signed)
Triage note: Patient reports left sided chest pain that began yesterday. Patient saw PCP for complaint yesterday and had en EKG that was normal. Patient states he is still having a lot of pain today. Patient speaking in full sentences with out difficulty.

## 2014-03-03 NOTE — ED Provider Notes (Signed)
Patient is a 38 y.o. male presenting with chest pain.   Chest Pain (Angina)   Pertinent negatives include no abdominal pain, no back pain, no cough, no fever, no headaches, no nausea, no numbness, no shortness of breath, no vomiting and no weakness.   38 yo WM presents with left sided chest pain onset yesterday morning at rest.  Pain 8/10, left chest, nonradiating, constant since onset, sharp.  Pain worse with deep breathing.  Denies nausea, vomiting, sob, cough, fever.  Pt c/o left leg cramping for the past few weeks. Pt is a [employer].  Was seen by PCP yesterday and states he had EKG and blood work done for chest pain and was told it was normal.  Dr. Katrinka Blazing, [place].  Pt states he does not know the doctor's office location or phone number.  Has not had and EKG done at any other facility.  Pt denies ever having any cardiac evaluation.     PMH-DM, appendectomy, cholecystectomy, back surgery  FH-unknown, pt adopted    Past Medical History   Diagnosis Date   ??? Diabetes [facility])         Past Surgical History   Procedure Laterality Date   ??? Hx orthopaedic       lumbar         History reviewed. No pertinent family history.     History     Social History   ??? Marital Status: N/A     Spouse Name: N/A     Number of Children: N/A   ??? Years of Education: N/A     Occupational History   ??? Not on file.     Social History Main Topics   ??? Smoking status: Never Smoker    ??? Smokeless tobacco: Not on file   ??? Alcohol Use: No   ??? Drug Use: No   ??? Sexual Activity: Not on file     Other Topics Concern   ??? Not on file     Social History Narrative   ??? No narrative on file                  ALLERGIES: Ketorolac and Morphine      Review of Systems   Constitutional: Negative for fever and chills.   Respiratory: Negative for cough and shortness of breath.    Cardiovascular: Positive for chest pain.   Gastrointestinal: Negative for nausea, vomiting, abdominal pain and diarrhea.   Genitourinary: Negative for dysuria and hematuria.    Musculoskeletal: Negative for back pain.   Skin: Negative for rash.   Neurological: Negative for weakness, numbness and headaches.   Psychiatric/Behavioral: Negative.    All other systems reviewed and are negative.      Filed Vitals:    03/03/14 0955   BP: 145/112   Pulse: 90   Temp: 99.1 ??F (37.3 ??C)   Resp: 20   Height:  (1.88 m)   Weight: 110.224 kg (243 lb)   SpO2: 99%            Physical Exam   Physical Examination: General appearance - alert, mild distress, oriented to person, place, and time and normal appearing weight  Eyes - pupils equal and reactive, extraocular eye movements intact  Neck - supple, no significant adenopathy  Chest - clear to auscultation, no wheezes, rales or rhonchi, symmetric air entry  Heart - normal rate, regular rhythm, normal S1, S2, no murmurs, rubs, clicks or gallops  Abdomen - soft, nontender, nondistended,  no masses or organomegaly  Back exam - full range of motion, no tenderness, palpable spasm or pain on motion  Neurological - alert, oriented, normal speech, no focal findings or movement disorder noted  Musculoskeletal - no joint tenderness, deformity or swelling  Extremities - peripheral pulses normal, no pedal edema, no clubbing or cyanosis  Skin - normal coloration and turgor, no rashes, no suspicious skin lesions noted  MDM  Number of Diagnoses or Management Options  Acute chest pain:      Amount and/or Complexity of Data Reviewed  Clinical lab tests: ordered and reviewed  Tests in the radiology section of CPT??: ordered and reviewed  Decide to obtain previous medical records or to obtain history from someone other than the patient: yes  Review and summarize past medical records: yes  Discuss the patient with other providers: yes (ED physician)  Independent visualization of images, tracings, or specimens: yes    Patient Progress  Patient progress: stable      Procedures    EKG interpretation: (Preliminary)   Rhythm: normal sinus rhythm; and regular . Rate (approx.): 93; Axis: normal; P wave: normal; QRS interval: normal ; ST/T wave: non-specific changes; q waves in I, aVL, V1, V2, slight st elevation in V2 no changes from EKG 01/06/2014    Pt with extensive narcotic prescriptions from numerous providers.  He states he is not on any other medications other than diabetes medication.  Pt has had 8 narcotic prescriptions filled this month from 8 different facilities and providers. He has had 38 narcotic prescriptions filled this year on PMP from 34 different providers.  Concern for drug seeking behavior.  Pt denies ever having an EKG done, except for yesterday at PCP office (on a Sunday and he doesn't know their location, first name of "Dr. Katrinka Blazing" or his phone number for the office).  Pt had EKG done at [facility] in July, unchanged from today.  VSS, pain for >24 hours with normal CE.      Pt refusing CT scan, signed out AMA.

## 2014-03-04 LAB — EKG, 12 LEAD, INITIAL
Atrial Rate: 93 {beats}/min
Calculated P Axis: 48 degrees
Calculated R Axis: 28 degrees
Calculated T Axis: 43 degrees
Diagnosis: NORMAL
P-R Interval: 150 ms
Q-T Interval: 374 ms
QRS Duration: 100 ms
QTC Calculation (Bezet): 465 ms
Ventricular Rate: 93 {beats}/min

## 2014-07-30 NOTE — Discharge Instructions (Signed)
Formatting of this note is different from the original.  Images from the original note were not included.    Back Pain: Care Instructions  Your Care Instructions    Back pain has many possible causes. It is often related to problems with muscles and ligaments of the back. It may also be related to problems with the nerves, discs, or bones of the back. Moving, lifting, standing, sitting, or sleeping in an awkward way can strain the back. Sometimes you don't notice the injury until later. Arthritis is another common cause of back pain.  Although it may hurt a lot, back pain usually improves on its own within several weeks. Most people recover in 12 weeks or less. Using good home treatment and being careful not to stress your back can help you feel better sooner.  Follow-up care is a key part of your treatment and safety. Be sure to make and go to all appointments, and call your doctor if you are having problems. It?s also a good idea to know your test results and keep a list of the medicines you take.  How can you care for yourself at home?   Sit or lie in positions that are most comfortable and reduce your pain. Try one of these positions when you lie down:   Lie on your back with your knees bent and supported by large pillows.   Lie on the floor with your legs on the seat of a sofa or chair.   Lie on your side with your knees and hips bent and a pillow between your legs.   Lie on your stomach if it does not make pain worse.   Do not sit up in bed, and avoid soft couches and twisted positions. Bed rest can help relieve pain at first, but it delays healing. Avoid bed rest after the first day of back pain.   Change positions every 30 minutes. If you must sit for long periods of time, take breaks from sitting. Get up and walk around, or lie in a comfortable position.   Try using a heating pad on a low or medium setting for 15 to 20 minutes every 2 or 3 hours. Try a warm shower in place of one session with the  heating pad.   You can also try an ice pack for 10 to 15 minutes every 2 to 3 hours. Put a thin cloth between the ice pack and your skin.   Take pain medicines exactly as directed.   If the doctor gave you a prescription medicine for pain, take it as prescribed.   If you are not taking a prescription pain medicine, ask your doctor if you can take an over-the-counter medicine.   Take short walks several times a day. You can start with 5 to 10 minutes, 3 or 4 times a day, and work up to longer walks. Walk on level surfaces and avoid hills and stairs until your back is better.   Return to work and other activities as soon as you can. Continued rest without activity is usually not good for your back.   To prevent future back pain, do exercises to stretch and strengthen your back and stomach. Learn how to use good posture, safe lifting techniques, and proper body mechanics.  When should you call for help?  Call your doctor now or seek immediate medical care if:   You have new or worsening numbness in your legs.   You have new or worsening weakness in  your legs. (This could make it hard to stand up.)   You lose control of your bladder or bowels.  Watch closely for changes in your health, and be sure to contact your doctor if:   Your pain gets worse.   You are not getting better after 2 weeks.   Where can you learn more?   Log into your personal health record on [email or web address] and enter I594 in the ?Search Health Library? box to learn more about "Back Pain: Care Instructions"     Not on [facility] yet? Visit [email or web address] and click the registration link to request an activation code.    2006-2015 [employer]. Care instructions adapted under license by [employer]. This care instruction is for use with your licensed healthcare professional. If you have questions about a medical condition or this instruction, always ask your healthcare professional. [employer] Inc[employer] disclaims any  warranty or liability for your use of this information.  Content Version: 10.7.482551; Current as of: Nov 22, 2013      Electronically signed by Alvino Chapel, PA at 07/30/2014  5:55 PM EST

## 2014-07-30 NOTE — ED Provider Notes (Signed)
Formatting of this note is different from the original.  Images from the original note were not included.    History     Chief Complaint   Patient presents with   ? Back Pain     fall approx 4 ft     HPI Comments: The patient presents here with low back pain post fall.  Patient said that he was helping some friends move use up in a box truck, on a large a dresser was about to fall out of the back, the patient attempted to jump out of the back, slipped, fell to the ground landing on his lower back/buttock area.  Patient immediately had pain.  The patient is a history of low back pain with sciatica.  Patient said this is different from as low back pain.  Patient is ambulatory with pain.  Patient denies any focal neurological deficits.    Patient is a 39 y.o. male presenting with back pain. The history is provided by the patient.   Back Pain  Location:  Lumbar spine  Quality:  Aching, cramping and stiffness  Stiffness is present:  All day  Radiates to:  Does not radiate  Pain severity:  Moderate  Pain is:  Same all the time  Onset quality:  Sudden  Duration:  1 hour  Timing:  Constant  Progression:  Unchanged  Chronicity:  New  Context: falling and lifting heavy objects    Context: not jumping from heights, not MCA, not MVA, not occupational injury, not pedestrian accident, not physical stress, not recent illness, not recent injury and not twisting    Relieved by:  Nothing  Worsened by:  Bending, movement, palpation, sitting, touching and twisting  Ineffective treatments:  None tried  Associated symptoms: tingling (Patient describes some tingling to the inner left thigh.  Patient denies saddle anesthesia or tingling.  Patient denies tingling or numbness in the feet.)    Associated symptoms: no abdominal pain, no abdominal swelling, no bladder incontinence, no bowel incontinence, no chest pain, no dysuria, no fever, no headaches, no leg pain, no numbness, no paresthesias, no pelvic pain, no perianal numbness, no  weakness and no weight loss    Recent surgery: History of back surgery, not recent.      Past Medical History   Diagnosis Date   ? Diabetes mellitus [facility])      Past Surgical History   Procedure Laterality Date   ? Hernia repair     ? Tonsillectomy     ? Cholecystectomy     ? Back surgery       History reviewed. No pertinent family history.    History   Substance Use Topics   ? Smoking status: Never Smoker    ? Smokeless tobacco: Current User   ? Alcohol Use: No     Review of Systems   Constitutional: Negative for fever and weight loss.   Cardiovascular: Negative for chest pain.   Gastrointestinal: Negative for abdominal pain and bowel incontinence.   Genitourinary: Negative for bladder incontinence, dysuria and pelvic pain.   Musculoskeletal: Positive for back pain.   Neurological: Positive for tingling (Patient describes some tingling to the inner left thigh.  Patient denies saddle anesthesia or tingling.  Patient denies tingling or numbness in the feet.). Negative for weakness, numbness, headaches and paresthesias.        Patient denies numbness or tingling in the feet, saddle anesthesia or urinary/fecal incontinence or retention.  Patient says that he does have anal  sphincter control.  The patient does describe some mild ?Tingling to the inner left thigh.   All other systems reviewed and are negative.    Physical Exam   Triage Vitals:   ED Triage Vitals   Temp 07/30/14 1634 98.5 F (36.9 C)   Temp src 07/30/14 1634 Oral   Heart Rate 07/30/14 1634 110 bpm   BP 07/30/14 1634 197/103 mmHg   Resp 07/30/14 1634 16   SpO2 07/30/14 1634 98 %     Physical Exam   Constitutional: He is oriented to person, place, and time. He appears well-developed and well-nourished. No distress.   HENT:   Head: Normocephalic and atraumatic.   Eyes: Conjunctivae and EOM are normal. Right eye exhibits no discharge. Left eye exhibits no discharge. No scleral icterus.   Neck: Normal range of motion. Neck supple.   Cardiovascular: Normal  rate, regular rhythm, normal heart sounds and intact distal pulses.  Exam reveals no gallop and no friction rub.    No murmur heard.  Pulmonary/Chest: Effort normal and breath sounds normal. No respiratory distress. He has no wheezes. He has no rales. He exhibits no tenderness.   Abdominal: Soft. He exhibits no distension. There is no tenderness. There is no rebound and no guarding.   Musculoskeletal: He exhibits tenderness.        Lumbar back: He exhibits decreased range of motion, tenderness, bony tenderness and pain. He exhibits no swelling, no edema, no deformity, no laceration, no spasm and normal pulse.        Back:    Neurological: He is alert and oriented to person, place, and time.   Skin: Skin is warm and dry. He is not diaphoretic.   Psychiatric: He has a normal mood and affect. His behavior is normal.   Nursing note and vitals reviewed.    ED Course   Vitals: BP 197/103 mmHg  Temp(Src) 98.5 F (36.9 C) (Oral)  Resp 16  Ht 6\' 2"  (1.88 m)  Wt 111.131 kg (245 lb)  BMI 31.44 kg/m2  SpO2 98%    Procedures    MDM  Number of Diagnoses or Management Options  Bilateral low back pain without sciatica: established and worsening  Decreased range of motion of trunk and back: established and worsening  Fall, initial encounter: new and requires workup  Diagnosis management comments: Xr Lumbar Spine 2 Or 3 View    07/30/2014   EXAM DESCRIPTION: XR LUMBAR SPINE 2 OR 3 VIEWS: 07/30/2014  CLINICAL INFORMATION: The patient is a 39 y/o  M with Low back pain post fall.  COMPARISON: None available  FINDINGS: The vertebral bodies are of normal height and alignment. No fractures are seen. Pedicles, transverse processes and sacroiliac joints appear unremarkable. Patient is undergone fusion of L4-5 with pedicle screws and Harrington rods.  The hardware appears  to be intact.     07/30/2014   IMPRESSION: Fusion of L4-5.  No acute fracture is identified.  Report dictation location: [id number]    Xr Sacrum And Coccyx 2  View    07/30/2014   EXAM DESCRIPTION: XR SACRUM AND COCCYX 2 VIEW: 07/30/2014  CLINICAL INFORMATION: The patient is a 39 y/o  M with Low back pain post fall  COMPARISON: None available.  FINDINGS: The sacroiliac joints are intact. The sacral ala are unremarkable. No fracture is seen. The coccyx appears unremarkable. The neural foramina appear symmetric. Patient is undergone fusion of L4-5.     07/30/2014   IMPRESSION: Negative examination.  Report  dictation location: [id number]    The results of images obtained today are reviewed with the patient before discharge.  Patient has no focal neurological deficits.  Patient ambulatory here but with pain.  Patient has some relief with Percocet here.  Patient given intramuscular Decadron here and a prescription for Flexeril Percocet at discharge.  Patient has Aleve at home he can take for pain.  Patient is educated about the signs and symptoms of every other day equina syndrome and told to return immediately if he has any of these/new/changing/worsening symptoms.  The patient is also to follow up with PCP if stable but not improving.  The patient verbalizes his understanding and is in agreement with treatment plan and discharge instructions.      Amount and/or Complexity of Data Reviewed  Tests in the radiology section of CPT: ordered and reviewed  Independent visualization of images, tracings, or specimens: yes    Patient Progress  Patient progress: stable    Alvino Chapel, PA  07/30/14 1756  Electronically signed by Dwana Curd, MD at 07/31/2014  2:07 AM EST

## 2014-08-14 ENCOUNTER — Encounter (HOSPITAL_COMMUNITY): Payer: Self-pay | Admitting: Emergency Medicine

## 2014-08-14 ENCOUNTER — Emergency Department (HOSPITAL_COMMUNITY)
Admission: EM | Admit: 2014-08-14 | Discharge: 2014-08-14 | Disposition: A | Discharge status: Home / Self Care | Payer: Medicaid - Out of State | Attending: Emergency Medicine | Admitting: Emergency Medicine

## 2014-08-14 DIAGNOSIS — E1165 Type 2 diabetes mellitus with hyperglycemia: Secondary | ICD-10-CM | POA: Insufficient documentation

## 2014-08-14 DIAGNOSIS — K0889 Other specified disorders of teeth and supporting structures: Secondary | ICD-10-CM

## 2014-08-14 DIAGNOSIS — K088 Other specified disorders of teeth and supporting structures: Secondary | ICD-10-CM | POA: Insufficient documentation

## 2014-08-14 DIAGNOSIS — Z79899 Other long term (current) drug therapy: Secondary | ICD-10-CM | POA: Insufficient documentation

## 2014-08-14 DIAGNOSIS — R739 Hyperglycemia, unspecified: Secondary | ICD-10-CM

## 2014-08-14 LAB — BASIC METABOLIC PANEL
Anion gap: 8 (ref 5–15)
BUN: 15 mg/dL (ref 6–23)
CO2: 25 mmol/L (ref 19–32)
Calcium: 8.9 mg/dL (ref 8.4–10.5)
Chloride: 99 mmol/L (ref 96–112)
Creatinine, Ser: 0.84 mg/dL (ref 0.50–1.35)
GFR calc Af Amer: >90 mL/min (ref >90)
GFR calc non Af Amer: >90 mL/min (ref >90)
Glucose, Bld: 316 mg/dL — ABNORMAL HIGH (ref 70–99)
Potassium: 3.9 mmol/L (ref 3.5–5.1)
Sodium: 132 mmol/L — ABNORMAL LOW (ref 135–145)

## 2014-08-14 MED ORDER — PENICILLIN V POTASSIUM 250 MG PO TABS: 250.0000 mg | ORAL_TABLET | Freq: Four times a day (QID) | ORAL | Status: AC

## 2014-08-14 MED ORDER — HYDROCODONE-ACETAMINOPHEN 5-325 MG PO TABS
1.0000 | ORAL_TABLET | Freq: Once | ORAL | Status: AC
  Administered 2014-08-14: 1 via ORAL
  Filled 2014-08-14: qty 1

## 2014-08-14 NOTE — ED Provider Notes (Signed)
CSN: [id number]     Arrival date & time 08/14/14  1430 History   First MD Initiated Contact with Patient 08/14/14 1441     Chief Complaint  Patient presents with  . Dental Pain     (Consider location/radiation/quality/duration/timing/severity/associated sxs/prior Treatment) HPI Comments: Pt come in with left lower dental pain for a week. Pt states he chews tobacco and the gum is eroded. Pt states that he has an appointment with the dentist in a week. No fever or swelling. Has been using oragel. Pt states that he has been taking his medication as prescribed for diabetes. Doesn't check his sugar daily but it isn't normally that high  The history is provided by the patient. No language interpreter was used.    Past Medical History  Diagnosis Date  . Diabetes mellitus without complication    Past Surgical History  Procedure Laterality Date  . Back surgery    . Cholecystectomy    . Appendectomy     History reviewed. No pertinent family history. History  Substance Use Topics  . Smoking status: Never Smoker   . Smokeless tobacco: Current User    Types: Chew  . Alcohol Use: No    Review of Systems  All other systems reviewed and are negative.     Allergies  Review of patient's allergies indicates no known allergies.  Home Medications   Prior to Admission medications   Medication Sig Start Date End Date Taking? Authorizing Provider  cyclobenzaprine (FLEXERIL) 10 MG tablet Take 1 tablet (10 mg total) by mouth 2 (two) times daily as needed for muscle spasms. 11/02/13   Gilda Creasehristopher J. Pollina, MD  glipiZIDE (GLUCOTROL) 10 MG tablet Take 10 mg by mouth 2 (two) times daily before a meal.    Historical Provider, MD  HYDROcodone-acetaminophen (NORCO/VICODIN) 5-325 MG per tablet Take 2 tablets by mouth every 4 (four) hours as needed for moderate pain. 11/02/13   Gilda Creasehristopher J. Pollina, MD  metFORMIN (GLUCOPHAGE) 1000 MG tablet Take 1,000 mg by mouth 2 (two) times daily.    Historical  Provider, MD  naproxen sodium (ALEVE) 220 MG tablet Take 440 mg by mouth daily as needed (pain).    Historical Provider, MD   BP 141/93 mmHg  Pulse 95  Temp(Src) 98.2 F (36.8 C) (Oral)  Resp 18  Ht 6\' 2"  (1.88 m)  Wt 250 lb (113.399 kg)  BMI 32.08 kg/m2  SpO2 99% Physical Exam  Constitutional: He appears well-developed and well-nourished.  HENT:  Right Ear: External ear normal.  Left Ear: External ear normal.  Mouth/Throat:    Eroded gum. No drainage  Cardiovascular: Normal rate and regular rhythm.   Pulmonary/Chest: Effort normal and breath sounds normal.  Musculoskeletal: Normal range of motion.  Neurological: He is alert.  Nursing note and vitals reviewed.   ED Course  Procedures (including critical care time) Labs Review Labs Reviewed  BASIC METABOLIC PANEL - Abnormal; Notable for the following:    Sodium 132 (*)    Glucose, Bld 316 (*)    All other components within normal limits    Imaging Review No results found.   EKG Interpretation None      MDM   Final diagnoses:  Pain, dental  Hyperglycemia    Pt has an elevated blood sugar. No anion gap. Discussed follow up with dentist. Pt has been getting narcotics from multiple places. Will given pcn for infection    Teressa LowerVrinda Amaziah Raisanen, NP 08/14/14 1615  Glynn OctaveStephen Rancour, MD 08/14/14 [phone]

## 2014-08-14 NOTE — ED Notes (Signed)
Pt states that he uses smokeless tobacco and has erosion on left lower side of mouth.  C/o pain in that area.

## 2014-08-14 NOTE — ED Notes (Signed)
Accucheck 317 at triage.

## 2014-08-14 NOTE — Discharge Instructions (Signed)
Follow up with the dentist as planned Dental Pain A tooth ache may be caused by cavities (tooth decay). Cavities expose the nerve of the tooth to air and hot or cold temperatures. It may come from an infection or abscess (also called a boil or furuncle) around your tooth. It is also often caused by dental caries (tooth decay). This causes the pain you are having. DIAGNOSIS  Your caregiver can diagnose this problem by exam. TREATMENT   If caused by an infection, it may be treated with medications which kill germs (antibiotics) and pain medications as prescribed by your caregiver. Take medications as directed.  Only take over-the-counter or prescription medicines for pain, discomfort, or fever as directed by your caregiver.  Whether the tooth ache today is caused by infection or dental disease, you should see your dentist as soon as possible for further care. SEEK MEDICAL CARE IF: The exam and treatment you received today has been provided on an emergency basis only. This is not a substitute for complete medical or dental care. If your problem worsens or new problems (symptoms) appear, and you are unable to meet with your dentist, call or return to this location. SEEK IMMEDIATE MEDICAL CARE IF:   You have a fever.  You develop redness and swelling of your face, jaw, or neck.  You are unable to open your mouth.  You have severe pain uncontrolled by pain medicine. MAKE SURE YOU:   Understand these instructions.  Will watch your condition.  Will get help right away if you are not doing well or get worse. Document Released: 06/20/2005 Document Revised: 09/12/2011 Document Reviewed: 02/06/2008 [facility] Patient Information 2015 [employer] [place]. This information is not intended to replace advice given to you by your health care provider. Make sure you discuss any questions you have with your health care provider.

## 2014-08-17 LAB — CBG MONITORING, ED: Glucose-Capillary: 317 mg/dL — ABNORMAL HIGH (ref 70–99)

## 2015-10-19 HISTORY — PX: AMPUTATION TOE: SHX6595

## 2016-01-05 ENCOUNTER — Encounter (HOSPITAL_COMMUNITY): Payer: Self-pay | Admitting: Emergency Medicine

## 2016-01-05 ENCOUNTER — Emergency Department (HOSPITAL_COMMUNITY): Payer: Medicaid - Out of State

## 2016-01-05 ENCOUNTER — Emergency Department (HOSPITAL_COMMUNITY)
Admission: EM | Admit: 2016-01-05 | Discharge: 2016-01-05 | Disposition: A | Discharge status: Home / Self Care | Payer: Medicaid - Out of State | Attending: Emergency Medicine | Admitting: Emergency Medicine

## 2016-01-05 DIAGNOSIS — E11621 Type 2 diabetes mellitus with foot ulcer: Secondary | ICD-10-CM | POA: Diagnosis not present

## 2016-01-05 DIAGNOSIS — Z79899 Other long term (current) drug therapy: Secondary | ICD-10-CM | POA: Diagnosis not present

## 2016-01-05 DIAGNOSIS — L97511 Non-pressure chronic ulcer of other part of right foot limited to breakdown of skin: Secondary | ICD-10-CM | POA: Diagnosis not present

## 2016-01-05 DIAGNOSIS — L03115 Cellulitis of right lower limb: Secondary | ICD-10-CM | POA: Insufficient documentation

## 2016-01-05 DIAGNOSIS — Z794 Long term (current) use of insulin: Secondary | ICD-10-CM | POA: Insufficient documentation

## 2016-01-05 DIAGNOSIS — L97519 Non-pressure chronic ulcer of other part of right foot with unspecified severity: Secondary | ICD-10-CM

## 2016-01-05 LAB — CBC WITH DIFFERENTIAL/PLATELET
BASOS PCT: 0 %
Basophils Absolute: 0 10*3/uL (ref 0.0–0.1)
EOS ABS: 0.4 10*3/uL (ref 0.0–0.7)
Eosinophils Relative: 4 %
HEMATOCRIT: 40 % (ref 39.0–52.0)
Hemoglobin: 13.2 g/dL (ref 13.0–17.0)
LYMPHS ABS: 0.8 10*3/uL (ref 0.7–4.0)
Lymphocytes Relative: 8 %
MCH: 29.2 pg (ref 26.0–34.0)
MCHC: 33 g/dL (ref 30.0–36.0)
MCV: 88.5 fL (ref 78.0–100.0)
MONO ABS: 0.7 10*3/uL (ref 0.1–1.0)
Monocytes Relative: 7 %
Neutro Abs: 8.2 10*3/uL — ABNORMAL HIGH (ref 1.7–7.7)
Neutrophils Relative %: 81 %
Platelets: 285 10*3/uL (ref 150–400)
RBC: 4.52 MIL/uL (ref 4.22–5.81)
RDW: 14 % (ref 11.5–15.5)
WBC: 10.1 10*3/uL (ref 4.0–10.5)

## 2016-01-05 LAB — BASIC METABOLIC PANEL
Anion gap: 9 (ref 5–15)
BUN: 30 mg/dL — ABNORMAL HIGH (ref 6–20)
CHLORIDE: 98 mmol/L — AB (ref 101–111)
CO2: 22 mmol/L (ref 22–32)
CREATININE: 1.11 mg/dL (ref 0.61–1.24)
Calcium: 9.3 mg/dL (ref 8.9–10.3)
GFR calc non Af Amer: >60 mL/min (ref >60)
Glucose, Bld: 431 mg/dL — ABNORMAL HIGH (ref 65–99)
Potassium: 4.4 mmol/L (ref 3.5–5.1)
SODIUM: 129 mmol/L — AB (ref 135–145)

## 2016-01-05 LAB — CBG MONITORING, ED: Glucose-Capillary: 341 mg/dL — ABNORMAL HIGH (ref 65–99)

## 2016-01-05 MED ORDER — INSULIN ASPART 100 UNIT/ML ~~LOC~~ SOLN
25.0000 [IU] | Freq: Once | SUBCUTANEOUS | Status: AC
  Administered 2016-01-05: 25 [IU] via SUBCUTANEOUS
  Filled 2016-01-05: qty 1

## 2016-01-05 MED ORDER — CEPHALEXIN 500 MG PO CAPS: 500.0000 mg | ORAL_CAPSULE | Freq: Four times a day (QID) | ORAL | Status: DC

## 2016-01-05 MED ORDER — SULFAMETHOXAZOLE-TRIMETHOPRIM 800-160 MG PO TABS: 1.0000 | ORAL_TABLET | Freq: Two times a day (BID) | ORAL | Status: AC

## 2016-01-05 MED ORDER — HYDROCODONE-ACETAMINOPHEN 5-325 MG PO TABS: 1.0000 | ORAL_TABLET | Freq: Once | ORAL | Status: DC

## 2016-01-05 NOTE — ED Notes (Signed)
Circular diabetic ulcer wound noticed to bottom of right foot surrounded by hard callus. Wound measurements 1.0x0.8x0.2. Possible undermining noted. Wound is red in side. Foot is warm to touch more than left. Pt noticed warmth last night. Pt also has poison ivy all over legs and arms.

## 2016-01-05 NOTE — Discharge Instructions (Signed)
Call your PCP tomorrow and your orthopedic surgeon for close follow-up and further pain control. Return for worsening symptoms, including fever, worsening pain or swelling despite antibiotics, vomiting and unable to keep down food/fluids or any other symptoms concerning to you.  Cellulitis Cellulitis is an infection of the skin and the tissue beneath it. The infected area is usually red and tender. Cellulitis occurs most often in the arms and lower legs.  CAUSES  Cellulitis is caused by bacteria that enter the skin through cracks or cuts in the skin. The most common types of bacteria that cause cellulitis are staphylococci and streptococci. SIGNS AND SYMPTOMS   Redness and warmth.  Swelling.  Tenderness or pain.  Fever. DIAGNOSIS  Your health care provider can usually determine what is wrong based on a physical exam. Blood tests may also be done. TREATMENT  Treatment usually involves taking an antibiotic medicine. HOME CARE INSTRUCTIONS   Take your antibiotic medicine as directed by your health care provider. Finish the antibiotic even if you start to feel better.  Keep the infected arm or leg elevated to reduce swelling.  Apply a warm cloth to the affected area up to 4 times per day to relieve pain.  Take medicines only as directed by your health care provider.  Keep all follow-up visits as directed by your health care provider. SEEK MEDICAL CARE IF:   You notice red streaks coming from the infected area.  Your red area gets larger or turns dark in color.  Your bone or joint underneath the infected area becomes painful after the skin has healed.  Your infection returns in the same area or another area.  You notice a swollen bump in the infected area.  You develop new symptoms.  You have a fever. SEEK IMMEDIATE MEDICAL CARE IF:   You feel very sleepy.  You develop vomiting or diarrhea.  You have a general ill feeling (malaise) with muscle aches and pains.   This  information is not intended to replace advice given to you by your health care provider. Make sure you discuss any questions you have with your health care provider.   Document Released: 03/30/2005 Document Revised: 03/11/2015 Document Reviewed: 09/05/2011 Elsevier Interactive Patient Education [employer].

## 2016-01-05 NOTE — ED Notes (Signed)
Pt ate breakfast. Supposed to give self insulin am, lunch, dinner and pm. States did not give self either insulin this am. Pt is very noncompliant.

## 2016-01-05 NOTE — ED Notes (Signed)
Pt driving.pt states foot hurts but tolerable. pt stated he would rather drive than to call someone to get him b/c of medication. edp aware. Pt has already taken tylenol and aleve this morning. Advised pt if pain becomes worse to let us know. Pt agreed

## 2016-01-05 NOTE — ED Provider Notes (Signed)
CSN: [id number]     Arrival date & time 01/05/16  0727 History   First MD Initiated Contact with Patient 01/05/16 [phone]     Chief Complaint  Patient presents with  . Diabetic Ulcer     (Consider location/radiation/quality/duration/timing/severity/associated sxs/prior Treatment) HPI 40 year old male who presents with diabetic foot ulcer. 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 [place] he states. States 3-4 weeks of foot ulcer to the sole of his left foot after peeling off dried skin from that area. The wound has not been healing, and over the past 3 weeks he has had slow progressive warmth and erythema to the proximal aspect of his foot. He has had some clear to bloody drainage from the ulcer but no appreciable pus. Has not had fever but has had occasional sweats. No nausea or vomiting. Past Medical History  Diagnosis Date  . Diabetes mellitus without complication [facility](HCC)    Past Surgical History  Procedure Laterality Date  . Back surgery    . Cholecystectomy    . Appendectomy     History reviewed. No pertinent family history. Social History  Substance Use Topics  . Smoking status: Never Smoker   . Smokeless tobacco: Current User    Types: Chew  . Alcohol Use: No    Review of Systems    Allergies  Tramadol; Ketorolac tromethamine; Morphine and related; and Suboxone   Home Medications   Prior to Admission medications   Medication Sig Start Date End Date Taking? Authorizing Provider  apixaban (ELIQUIS) 5 MG TABS tablet Take 5 mg by mouth daily.   Yes Historical Provider, MD  gabapentin (NEURONTIN) 400 MG capsule Take 400 mg by mouth 2 (two) times daily. And then take 2 capsules at bedtime.   Yes Historical Provider, MD  insulin aspart (NOVOLOG) 100 UNIT/ML injection Inject 12 Units into the skin 3 (three)  times daily before meals. And then sliding scale as needed.   Yes Historical Provider, MD  insulin glargine (LANTUS) 100 UNIT/ML injection Inject 60 Units into the skin 2 (two) times daily.   Yes Historical Provider, MD   BP 188/102 mmHg  Pulse 91  Temp(Src) 97.9 F (36.6 C) (Oral)  Resp 17  Ht 6\' 2"  (1.88 m)  Wt 270 lb (122.471 kg)  BMI 34.65 kg/m2  SpO2 98% Physical Exam Physical Exam  Nursing note and vitals reviewed. Constitutional: Well developed, well nourished, non-toxic, and in no acute distress Head: Normocephalic and atraumatic.  Mouth/Throat: Oropharynx is clear and moist.  Neck: Normal range of motion. Neck supple.  Cardiovascular: Normal rate and regular rhythm.  +2 DP pulse Pulmonary/Chest: Effort normal and breath sounds normal.  Abdominal: Soft. There is no tenderness. There is no rebound and no guarding.  Musculoskeletal: focused exam of right foot w/ amputated 1-5th metatarsals. There is about 1 x 1 cm ulcer at the base of the 4-5th metatarsal without drainage. There is mild warmth and erythema to the distal aspect of the right foot.  Neurological: Alert, no facial droop, fluent speech Skin: Skin is warm and dry.  Psychiatric: Cooperative  ED Course  Procedures (including critical care time) Labs Review Labs Reviewed  CBC WITH DIFFERENTIAL/PLATELET - Abnormal; Notable for the following:    Neutro Abs 8.2 (*)    All other components within normal limits  BASIC METABOLIC PANEL - Abnormal; Notable for the  following:    Sodium 129 (*)    Chloride 98 (*)    Glucose, Bld 431 (*)    BUN 30 (*)    All other components within normal limits    Imaging Review Dg Foot Complete Right  01/05/2016  CLINICAL DATA:  Diabetic ulcer.  Question infection. EXAM: RIGHT FOOT COMPLETE - 3+ VIEW COMPARISON:  None. FINDINGS: Prior amputation of the first through fourth toes at the level of the MTP joints and fifth transmetatarsal amputation. Soft tissue defect with soft tissue gas  noted laterally at the stump. No underlying bony changes of osteomyelitis. IMPRESSION: No radiographic changes of osteomyelitis. Electronically Signed   By: Charlett NoseKevin  Dover M.D.   On: 01/05/2016 08:52   I have personally reviewed and evaluated these images and lab results as part of my medical decision-making.   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 On chart review, has been in and out of [facility] system with frequent episodes of foot pain, redness and swelling. Has had MRI 11/2015 negative for OM, and frequent x-rays in June 2017 that have been unremarkable. He states he has take one day of doxycycline and ciprofloxacin left over from prior prescription.  Afebrile here, well appearing, not appearing systemically ill. No leukocytosis. With hyperglycemia w/o DKA. Did not take insulin take and had many buiscuits for breakfast. H/o of medication noncompliance with poorly controlled blood sugars. Consistent with that. Given novlog per his sliding scale. X-ray without osteomyelitis. Low suspicion for this currently given frequent follow-up with [facility] and [facility] w/ repeat imaging. Will treat as outpatient for cellulitis with keflex and bactrim. He states he will call [facility] tomorrow and PCP to set up close follow-up appointment. Not discharged with narcotic medications. Prior documentation states frequent doctor shopping and prior prescriptions by multiple providers.  Strict return and follow-up instructions reviewed. She/He expressed understanding of all discharge instructions and felt comfortable with the plan of care.       Lavera Guiseana Duo  Ashlyn Cabler, MD 01/05/16 1011

## 2016-01-05 NOTE — ED Notes (Signed)
Pt states he has a right foot ulcer present for several weeks with worsening pain and redness.

## 2016-04-03 DIAGNOSIS — I82409 Acute embolism and thrombosis of unspecified deep veins of unspecified lower extremity: Secondary | ICD-10-CM

## 2016-04-03 HISTORY — DX: Acute embolism and thrombosis of unspecified deep veins of unspecified lower extremity: I82.409

## 2016-04-11 ENCOUNTER — Emergency Department (HOSPITAL_COMMUNITY): Payer: Medicaid - Out of State

## 2016-04-11 ENCOUNTER — Inpatient Hospital Stay (HOSPITAL_COMMUNITY)
Admission: EM | Admit: 2016-04-11 | Discharge: 2016-04-13 | DRG: 176 | Disposition: A | Discharge status: Home / Self Care | Payer: Medicaid - Out of State | Attending: Internal Medicine | Admitting: Internal Medicine

## 2016-04-11 ENCOUNTER — Encounter (HOSPITAL_COMMUNITY): Payer: Self-pay | Admitting: Emergency Medicine

## 2016-04-11 DIAGNOSIS — Z9114 Patient's other noncompliance with medication regimen: Secondary | ICD-10-CM

## 2016-04-11 DIAGNOSIS — L97519 Non-pressure chronic ulcer of other part of right foot with unspecified severity: Secondary | ICD-10-CM | POA: Diagnosis present

## 2016-04-11 DIAGNOSIS — F419 Anxiety disorder, unspecified: Secondary | ICD-10-CM | POA: Diagnosis present

## 2016-04-11 DIAGNOSIS — E11621 Type 2 diabetes mellitus with foot ulcer: Secondary | ICD-10-CM | POA: Diagnosis present

## 2016-04-11 DIAGNOSIS — F1920 Other psychoactive substance dependence, uncomplicated: Secondary | ICD-10-CM

## 2016-04-11 DIAGNOSIS — Z79899 Other long term (current) drug therapy: Secondary | ICD-10-CM

## 2016-04-11 DIAGNOSIS — I252 Old myocardial infarction: Secondary | ICD-10-CM

## 2016-04-11 DIAGNOSIS — Z794 Long term (current) use of insulin: Secondary | ICD-10-CM

## 2016-04-11 DIAGNOSIS — G8929 Other chronic pain: Secondary | ICD-10-CM

## 2016-04-11 DIAGNOSIS — I2699 Other pulmonary embolism without acute cor pulmonale: Principal | ICD-10-CM | POA: Diagnosis present

## 2016-04-11 DIAGNOSIS — Z765 Malingerer [conscious simulation]: Secondary | ICD-10-CM

## 2016-04-11 DIAGNOSIS — M868X7 Other osteomyelitis, ankle and foot: Secondary | ICD-10-CM | POA: Diagnosis present

## 2016-04-11 DIAGNOSIS — M869 Osteomyelitis, unspecified: Secondary | ICD-10-CM

## 2016-04-11 DIAGNOSIS — IMO0002 Reserved for concepts with insufficient information to code with codable children: Secondary | ICD-10-CM

## 2016-04-11 DIAGNOSIS — F1120 Opioid dependence, uncomplicated: Secondary | ICD-10-CM | POA: Diagnosis present

## 2016-04-11 DIAGNOSIS — Z91199 Patient's noncompliance with other medical treatment and regimen due to unspecified reason: Secondary | ICD-10-CM

## 2016-04-11 DIAGNOSIS — Z86711 Personal history of pulmonary embolism: Secondary | ICD-10-CM

## 2016-04-11 DIAGNOSIS — I454 Nonspecific intraventricular block: Secondary | ICD-10-CM | POA: Diagnosis present

## 2016-04-11 DIAGNOSIS — I10 Essential (primary) hypertension: Secondary | ICD-10-CM

## 2016-04-11 DIAGNOSIS — Z7901 Long term (current) use of anticoagulants: Secondary | ICD-10-CM

## 2016-04-11 DIAGNOSIS — Z86718 Personal history of other venous thrombosis and embolism: Secondary | ICD-10-CM

## 2016-04-11 DIAGNOSIS — Y835 Amputation of limb(s) as the cause of abnormal reaction of the patient, or of later complication, without mention of misadventure at the time of the procedure: Secondary | ICD-10-CM | POA: Diagnosis present

## 2016-04-11 DIAGNOSIS — Z87891 Personal history of nicotine dependence: Secondary | ICD-10-CM

## 2016-04-11 DIAGNOSIS — Z9119 Patient's noncompliance with other medical treatment and regimen: Secondary | ICD-10-CM

## 2016-04-11 DIAGNOSIS — Z9049 Acquired absence of other specified parts of digestive tract: Secondary | ICD-10-CM

## 2016-04-11 DIAGNOSIS — E1165 Type 2 diabetes mellitus with hyperglycemia: Secondary | ICD-10-CM | POA: Diagnosis present

## 2016-04-11 DIAGNOSIS — E118 Type 2 diabetes mellitus with unspecified complications: Secondary | ICD-10-CM

## 2016-04-11 DIAGNOSIS — Z89421 Acquired absence of other right toe(s): Secondary | ICD-10-CM

## 2016-04-11 DIAGNOSIS — G894 Chronic pain syndrome: Secondary | ICD-10-CM

## 2016-04-11 DIAGNOSIS — N179 Acute kidney failure, unspecified: Secondary | ICD-10-CM

## 2016-04-11 DIAGNOSIS — E1169 Type 2 diabetes mellitus with other specified complication: Secondary | ICD-10-CM | POA: Diagnosis present

## 2016-04-11 DIAGNOSIS — T8740 Infection of amputation stump, unspecified extremity: Secondary | ICD-10-CM | POA: Diagnosis present

## 2016-04-11 HISTORY — DX: Acute embolism and thrombosis of unspecified deep veins of unspecified lower extremity: I82.409

## 2016-04-11 HISTORY — DX: Essential (primary) hypertension: I10

## 2016-04-11 HISTORY — DX: Unspecified disorder of synovium and tendon, left shoulder: M67.912

## 2016-04-11 LAB — CBC
HCT: 40 % (ref 39.0–52.0)
Hemoglobin: 13 g/dL (ref 13.0–17.0)
MCH: 28.8 pg (ref 26.0–34.0)
MCHC: 32.5 g/dL (ref 30.0–36.0)
MCV: 88.5 fL (ref 78.0–100.0)
PLATELETS: 315 10*3/uL (ref 150–400)
RBC: 4.52 MIL/uL (ref 4.22–5.81)
RDW: 13.5 % (ref 11.5–15.5)
WBC: 9.2 10*3/uL (ref 4.0–10.5)

## 2016-04-11 LAB — BASIC METABOLIC PANEL
Anion gap: 12 (ref 5–15)
BUN: 22 mg/dL — AB (ref 6–20)
CALCIUM: 9.7 mg/dL (ref 8.9–10.3)
CHLORIDE: 95 mmol/L — AB (ref 101–111)
CO2: 25 mmol/L (ref 22–32)
CREATININE: 1.39 mg/dL — AB (ref 0.61–1.24)
GFR calc Af Amer: >60 mL/min (ref >60)
GFR calc non Af Amer: >60 mL/min (ref >60)
Glucose, Bld: 397 mg/dL — ABNORMAL HIGH (ref 65–99)
Potassium: 4.2 mmol/L (ref 3.5–5.1)
SODIUM: 132 mmol/L — AB (ref 135–145)

## 2016-04-11 LAB — PROTIME-INR
INR: 1.07
PROTHROMBIN TIME: 13.9 s (ref 11.4–15.2)

## 2016-04-11 LAB — I-STAT TROPONIN, ED: Troponin i, poc: 0 ng/mL (ref 0.00–0.08)

## 2016-04-11 MED ORDER — SODIUM CHLORIDE 0.9 % IV BOLUS (SEPSIS)
1000.0000 mL | Freq: Once | INTRAVENOUS | Status: AC
  Administered 2016-04-11: 1000 mL via INTRAVENOUS

## 2016-04-11 MED ORDER — IOPAMIDOL (ISOVUE-370) INJECTION 76%
INTRAVENOUS | Status: AC
  Administered 2016-04-12: 100 mL
  Filled 2016-04-11: qty 100

## 2016-04-11 NOTE — ED Triage Notes (Signed)
[facility] 15 [facility], states feels similar to previous OE last year. 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 States pain wrose with inspiration.

## 2016-04-11 NOTE — ED Provider Notes (Addendum)
[facility]-EMERGENCY DEPT Provider Note   CSN: [id number] Arrival date & time: 04/11/16  2248  By signing my name below, I, Rosario Adie, attest that this documentation has been prepared under the direction and in the presence of Tomasita Crumble, MD. Electronically Signed: Rosario Adie, ED Scribe. 04/11/16. 11:28 PM.  History   Chief Complaint Chief Complaint  Patient presents with  . Shortness of Breath  . Chest Pain   The history is provided by the patient. No language interpreter was used.    HPI Comments: Harry Smith is a 40 y.o. male with a PMHx of DM, who presents to the Emergency Department complaining of sudden onset substernal chest pain onset ~25 minutes PTA. Pt reports associated SOB secondary to the onset of his chest pain. He further states that he has had a moderate, dry cough which began prior to his chest pain today; however, he notes that since the onset of his chest pain that his cough now has a "blood-tinged taste". Pt reports that his pain is exacerbated with deep inspirations. No treatments were tried prior to coming into the ED for his current pain. 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 Pt has been compliant with this medication recently. He additionally states that his pain today is similar to his h/o pain from his prior PE. 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 Pt is currently undergoing Vancocin therapy for an ulcer to the plantar aspect of his foot which has appeared since this surgery. No recent long travel, fracture, prolonged immobilization, or hormone use otherwise. Denies diaphoresis, emesis, rhinorrhea, fever, recent URI-like symptoms, or any other associated symptoms.   PCP: Kipp Laurence, MD  Past Medical History:  Diagnosis Date  . Diabetes mellitus without complication (HCC)    There are no active problems to  display for this patient.  Past Surgical History:  Procedure Laterality Date  . APPENDECTOMY    . BACK SURGERY    . CHOLECYSTECTOMY      Home Medications    Prior to Admission medications   Medication Sig Start Date End Date Taking? Authorizing Provider  apixaban (ELIQUIS) 5 MG TABS tablet Take 5 mg by mouth daily.    Historical Provider, MD  cephALEXin (KEFLEX) 500 MG capsule Take 1 capsule (500 mg total) by mouth 4 (four) times daily. 01/05/16   Lavera Guise, MD  gabapentin (NEURONTIN) 400 MG capsule Take 400 mg by mouth 2 (two) times daily. And then take 2 capsules at bedtime.    Historical Provider, MD  insulin aspart (NOVOLOG) 100 UNIT/ML injection Inject 12 Units into the skin 3 (three) times daily before meals. And then sliding scale as needed.    Historical Provider, MD  insulin glargine (LANTUS) 100 UNIT/ML injection Inject 60 Units into the skin 2 (two) times daily.    Historical Provider, MD   Family History Family History  Problem Relation Age of Onset  . Adopted: Yes  . Family history unknown: Yes    Social History Social History  Substance Use Topics  . Smoking status: Never Smoker  . Smokeless tobacco: Current User    Types: Chew  . Alcohol use No   Allergies   Tramadol; Ketorolac tromethamine; Morphine and related; and Suboxone  [buprenorphine hcl-naloxone hcl]  Review of Systems Review of Systems A complete 10 system review of systems  was obtained and all systems are negative except as noted in the HPI and PMH.   Physical Exam Updated Vital Signs BP 163/84 (BP Location: Left Arm)   Pulse 87   Temp 98.5 F (36.9 C) (Oral)   Resp 15   Ht 6\' 2"  (1.88 m)   Wt 250 lb (113.4 kg)   SpO2 100%   BMI 32.10 kg/m   Physical Exam  Constitutional: He is oriented to person, place, and time. Vital signs are normal. He appears well-developed and well-nourished.  Non-toxic appearance. He does not appear ill. No distress.  HENT:  Head: Normocephalic and  atraumatic.  Nose: Nose normal.  Mouth/Throat: Oropharynx is clear and moist. No oropharyngeal exudate.  Eyes: Conjunctivae and EOM are normal. Pupils are equal, round, and reactive to light. No scleral icterus.  Neck: Normal range of motion. Neck supple. No tracheal deviation, no edema, no erythema and normal range of motion present. No thyroid mass and no thyromegaly present.  Cardiovascular: Normal rate, regular rhythm, S1 normal, S2 normal, normal heart sounds, intact distal pulses and normal pulses.  Exam reveals no gallop and no friction rub.   No murmur heard. Pulmonary/Chest: Effort normal and breath sounds normal. No respiratory distress. He has no wheezes. He has no rhonchi. He has no rales.  Abdominal: Soft. Normal appearance and bowel sounds are normal. He exhibits no distension, no ascites and no mass. There is no hepatosplenomegaly. There is no tenderness. There is no rebound, no guarding and no CVA tenderness.  Musculoskeletal: He exhibits no edema or tenderness.  S/p right transmetatarsal amputation.   Lymphadenopathy:    He has no cervical adenopathy.  Neurological: He is alert and oriented to person, place, and time. He has normal strength. No cranial nerve deficit or sensory deficit.  Skin: Skin is warm, dry and intact. No petechiae and no rash noted. He is not diaphoretic. No erythema. No pallor.  Nursing note and vitals reviewed.  ED Treatments / Results  DIAGNOSTIC STUDIES: Oxygen Saturation is 100% on RA, normal by my interpretation.   COORDINATION OF CARE: 11:15 PM-Discussed next steps with pt. Pt verbalized understanding and is agreeable with the plan.   Labs (all labs ordered are listed, but only abnormal results are displayed) Labs Reviewed  BASIC METABOLIC PANEL - Abnormal; Notable for the following:       Result Value   Sodium 132 (*)    Chloride 95 (*)    Glucose, Bld 397 (*)    BUN 22 (*)    Creatinine, Ser 1.39 (*)    All other components within  normal limits  CBC  PROTIME-INR  I-STAT TROPOININ, ED    EKG  EKG Interpretation  Date/Time:  Monday April 11 2016 22:54:39 EDT Ventricular Rate:  98 PR Interval:  178 QRS Duration: 104 QT Interval:  362 QTC Calculation: 462 R Axis:   36 Text Interpretation:  Normal sinus rhythm Incomplete right bundle branch block Septal infarct , age undetermined Abnormal ECG No old tracing to compare Confirmed by Erroll Luna [phone]) on 04/11/2016 11:32:16 PM       Radiology Ct Angio Chest Pe W Or Wo Contrast  Result Date: 04/12/2016 CLINICAL DATA:  Acute onset of mid chest pain, radiating to the back. Initial encounter. EXAM: CT ANGIOGRAPHY CHEST WITH CONTRAST TECHNIQUE: Multidetector CT imaging of the chest was performed using the standard protocol during bolus administration of intravenous contrast. Multiplanar CT image reconstructions and MIPs were obtained to evaluate the vascular anatomy.  CONTRAST:  100 mL of Isovue 370 IV contrast COMPARISON:  Chest radiograph performed 04/11/2016 FINDINGS: Cardiovascular: Pulmonary embolus is noted within pulmonary arteries to the right upper lobe, right lower lobe and left lower lobe. The RV/LV ratio of 1.07 is concerning for right heart strain and submassive pulmonary embolus. The heart is grossly unremarkable in appearance. The thoracic aorta appears intact. The great vessels are unremarkable in appearance. Mediastinum/Nodes: The mediastinum is grossly unremarkable. No mediastinal lymphadenopathy is seen. No pericardial effusion is identified. The thyroid gland is unremarkable in appearance. No axillary lymphadenopathy is appreciated. Lungs/Pleura: A vague mosaic pattern of parenchymal attenuation is noted bilaterally, which may reflect a variety of processes, including atelectasis, mild interstitial edema or infection. No pleural effusion or pneumothorax is seen. No masses are identified. Upper Abdomen: The visualized portions of the liver and spleen  are unremarkable. The patient is status post cholecystectomy, with clips noted at the gallbladder fossa. The visualized portions of the pancreas, adrenal glands and kidneys are within normal limits. Musculoskeletal: No acute osseous abnormalities are identified. The visualized musculature is unremarkable in appearance. Review of the MIP images confirms the above findings. IMPRESSION: 1. Pulmonary embolus within pulmonary artery branches to the right upper lobe, right lower lobe and left lower lobe. CT evidence of right heart strain (RV/LV Ratio = 1.07) consistent with at least submassive (intermediate risk) PE. The presence of right heart strain has been associated with an increased risk of morbidity and mortality. Please activate Code PE by paging [phone]. 2. Vague mosaic pattern of parenchymal attenuation noted bilaterally, which may reflect a variety of processes, including atelectasis, mild interstitial edema or infection. Critical Value/emergent results were called by telephone at the time of interpretation on 04/12/2016 at 1:14 am to Dr. Tomasita CrumbleADELEKE Renesmee Raine, who verbally acknowledged these results. Electronically Signed   By: Roanna RaiderJeffery  Chang M.D.   On: 04/12/2016 01:15   Dg Chest Portable 1 View  Result Date: 04/12/2016 CLINICAL DATA:  Initial value a acute chest pain, shortness of breath. EXAM: PORTABLE CHEST 1 VIEW COMPARISON:  None available. FINDINGS: Cardiac mediastinal silhouettes are within normal limits. Lungs are normally inflated. No focal infiltrates. No pulmonary edema or pleural effusion. No pneumothorax. No acute osseous abnormality. IMPRESSION: No active disease. Electronically Signed   By: Rise MuBenjamin  McClintock M.D.   On: 04/12/2016 00:15    Procedures Procedures (including critical care time)  Medications Ordered in ED Medications  iopamidol (ISOVUE-370) 76 % injection (not administered)  sodium chloride 0.9 % bolus 1,000 mL (0 mLs Intravenous Stopped 04/12/16 0034)  iopamidol  (ISOVUE-370) 76 % injection (100 mLs  Contrast Given 04/12/16 0014)  fentaNYL (SUBLIMAZE) injection 100 mcg (100 mcg Intravenous Given 04/12/16 0047)     Initial Impression / Assessment and Plan / ED Course  I have reviewed the triage vital signs and the nursing notes.  Pertinent labs & imaging results that were available during my care of the patient were reviewed by me and considered in my medical decision making (see chart for details).  Clinical Course    Patient presents to the ED for CP and SOB.  He states he has a h/o PE and DVT and is compliant with xarelto.  VS are normal.  He continues to ask for pain medication frequently and has allergies to tramadol, toradol, and morphine.  Upon review of care everywhere, he has been to multiple hospitals with similar stories and has had concern for drug seeking behavior in the past. He was given fentanyl in the  ED and I will await CT results prior to any further pain medication given.  2:11 AM He continues to appear well and in NAD. He is refusing cardiac monitoring until he gets more pain medication.  CT does reveal multiple PEs.  Patient admitted to hospitalist for further care.  Dr. Toniann Fail advised to give dilaudid for pain control.  Patient threatening to leave and take off his cardiac monitor if he does not receieve pain medication.  Final Clinical Impressions(s) / ED Diagnoses   Final diagnoses:  None    New Prescriptions New Prescriptions   No medications on file     I personally performed the services described in this documentation, which was scribed in my presence. The recorded information has been reviewed and is accurate.           Tomasita Crumble, MD 04/12/16 [phone]

## 2016-04-12 ENCOUNTER — Emergency Department (HOSPITAL_COMMUNITY): Payer: Medicaid - Out of State

## 2016-04-12 ENCOUNTER — Encounter (HOSPITAL_COMMUNITY): Payer: Self-pay | Admitting: Radiology

## 2016-04-12 DIAGNOSIS — G894 Chronic pain syndrome: Secondary | ICD-10-CM | POA: Diagnosis present

## 2016-04-12 DIAGNOSIS — L97519 Non-pressure chronic ulcer of other part of right foot with unspecified severity: Secondary | ICD-10-CM | POA: Diagnosis present

## 2016-04-12 DIAGNOSIS — Z765 Malingerer [conscious simulation]: Secondary | ICD-10-CM | POA: Diagnosis not present

## 2016-04-12 DIAGNOSIS — Z86718 Personal history of other venous thrombosis and embolism: Secondary | ICD-10-CM | POA: Diagnosis not present

## 2016-04-12 DIAGNOSIS — Z794 Long term (current) use of insulin: Secondary | ICD-10-CM

## 2016-04-12 DIAGNOSIS — Z79899 Other long term (current) drug therapy: Secondary | ICD-10-CM | POA: Diagnosis not present

## 2016-04-12 DIAGNOSIS — T8740 Infection of amputation stump, unspecified extremity: Secondary | ICD-10-CM | POA: Diagnosis present

## 2016-04-12 DIAGNOSIS — F1120 Opioid dependence, uncomplicated: Secondary | ICD-10-CM | POA: Diagnosis present

## 2016-04-12 DIAGNOSIS — Z9049 Acquired absence of other specified parts of digestive tract: Secondary | ICD-10-CM | POA: Diagnosis not present

## 2016-04-12 DIAGNOSIS — Z7901 Long term (current) use of anticoagulants: Secondary | ICD-10-CM | POA: Diagnosis not present

## 2016-04-12 DIAGNOSIS — Z87891 Personal history of nicotine dependence: Secondary | ICD-10-CM | POA: Diagnosis not present

## 2016-04-12 DIAGNOSIS — Z86711 Personal history of pulmonary embolism: Secondary | ICD-10-CM | POA: Diagnosis not present

## 2016-04-12 DIAGNOSIS — E1165 Type 2 diabetes mellitus with hyperglycemia: Secondary | ICD-10-CM | POA: Diagnosis present

## 2016-04-12 DIAGNOSIS — Z89421 Acquired absence of other right toe(s): Secondary | ICD-10-CM | POA: Diagnosis not present

## 2016-04-12 DIAGNOSIS — E1169 Type 2 diabetes mellitus with other specified complication: Secondary | ICD-10-CM | POA: Diagnosis present

## 2016-04-12 DIAGNOSIS — I2699 Other pulmonary embolism without acute cor pulmonale: Secondary | ICD-10-CM | POA: Diagnosis present

## 2016-04-12 DIAGNOSIS — N179 Acute kidney failure, unspecified: Secondary | ICD-10-CM | POA: Diagnosis present

## 2016-04-12 DIAGNOSIS — Z9114 Patient's other noncompliance with medication regimen: Secondary | ICD-10-CM | POA: Diagnosis not present

## 2016-04-12 DIAGNOSIS — R079 Chest pain, unspecified: Secondary | ICD-10-CM | POA: Diagnosis present

## 2016-04-12 DIAGNOSIS — I252 Old myocardial infarction: Secondary | ICD-10-CM | POA: Diagnosis not present

## 2016-04-12 DIAGNOSIS — M868X7 Other osteomyelitis, ankle and foot: Secondary | ICD-10-CM | POA: Diagnosis present

## 2016-04-12 DIAGNOSIS — E11621 Type 2 diabetes mellitus with foot ulcer: Secondary | ICD-10-CM | POA: Diagnosis present

## 2016-04-12 DIAGNOSIS — Z9119 Patient's noncompliance with other medical treatment and regimen: Secondary | ICD-10-CM | POA: Diagnosis not present

## 2016-04-12 DIAGNOSIS — I82621 Acute embolism and thrombosis of deep veins of right upper extremity: Secondary | ICD-10-CM

## 2016-04-12 DIAGNOSIS — F419 Anxiety disorder, unspecified: Secondary | ICD-10-CM | POA: Diagnosis present

## 2016-04-12 DIAGNOSIS — I454 Nonspecific intraventricular block: Secondary | ICD-10-CM | POA: Diagnosis present

## 2016-04-12 DIAGNOSIS — G8929 Other chronic pain: Secondary | ICD-10-CM

## 2016-04-12 DIAGNOSIS — Y835 Amputation of limb(s) as the cause of abnormal reaction of the patient, or of later complication, without mention of misadventure at the time of the procedure: Secondary | ICD-10-CM | POA: Diagnosis present

## 2016-04-12 LAB — BRAIN NATRIURETIC PEPTIDE: B NATRIURETIC PEPTIDE 5: 9.1 pg/mL (ref 0.0–100.0)

## 2016-04-12 LAB — BASIC METABOLIC PANEL
Anion gap: 10 (ref 5–15)
BUN: 20 mg/dL (ref 6–20)
CALCIUM: 9.1 mg/dL (ref 8.9–10.3)
CO2: 24 mmol/L (ref 22–32)
CREATININE: 1.09 mg/dL (ref 0.61–1.24)
Chloride: 98 mmol/L — ABNORMAL LOW (ref 101–111)
Glucose, Bld: 326 mg/dL — ABNORMAL HIGH (ref 65–99)
Potassium: 4 mmol/L (ref 3.5–5.1)
SODIUM: 132 mmol/L — AB (ref 135–145)

## 2016-04-12 LAB — CBC
HCT: 37.6 % — ABNORMAL LOW (ref 39.0–52.0)
Hemoglobin: 12.2 g/dL — ABNORMAL LOW (ref 13.0–17.0)
MCH: 28.5 pg (ref 26.0–34.0)
MCHC: 32.4 g/dL (ref 30.0–36.0)
MCV: 87.9 fL (ref 78.0–100.0)
PLATELETS: 246 10*3/uL (ref 150–400)
RBC: 4.28 MIL/uL (ref 4.22–5.81)
RDW: 13.6 % (ref 11.5–15.5)
WBC: 9.5 10*3/uL (ref 4.0–10.5)

## 2016-04-12 LAB — GLUCOSE, CAPILLARY
GLUCOSE-CAPILLARY: 310 mg/dL — AB (ref 65–99)
GLUCOSE-CAPILLARY: 330 mg/dL — AB (ref 65–99)
Glucose-Capillary: 295 mg/dL — ABNORMAL HIGH (ref 65–99)

## 2016-04-12 LAB — TROPONIN I: Troponin I: <0.03 ng/mL (ref <0.03)

## 2016-04-12 LAB — SEDIMENTATION RATE: SED RATE: 40 mm/h — AB (ref 0–16)

## 2016-04-12 LAB — HEPARIN LEVEL (UNFRACTIONATED)

## 2016-04-12 LAB — APTT: aPTT: 30 seconds (ref 24–36)

## 2016-04-12 LAB — MRSA PCR SCREENING: MRSA BY PCR: NEGATIVE

## 2016-04-12 MED ORDER — ONDANSETRON HCL 4 MG PO TABS: 4.0000 mg | ORAL_TABLET | Freq: Four times a day (QID) | ORAL | Status: DC | PRN

## 2016-04-12 MED ORDER — FENTANYL CITRATE (PF) 100 MCG/2ML IJ SOLN
100.0000 ug | Freq: Once | INTRAMUSCULAR | Status: AC
  Administered 2016-04-12: 100 ug via INTRAVENOUS
  Filled 2016-04-12: qty 2

## 2016-04-12 MED ORDER — ENOXAPARIN SODIUM 120 MG/0.8ML ~~LOC~~ SOLN
1.0000 mg/kg | Freq: Two times a day (BID) | SUBCUTANEOUS | Status: DC
  Administered 2016-04-12 – 2016-04-13 (×3): 115 mg via SUBCUTANEOUS
  Filled 2016-04-12 (×4): qty 0.76

## 2016-04-12 MED ORDER — HEPARIN BOLUS VIA INFUSION
6500.0000 [IU] | Freq: Once | INTRAVENOUS | Status: AC
  Administered 2016-04-12: 6500 [IU] via INTRAVENOUS
  Filled 2016-04-12: qty 6500

## 2016-04-12 MED ORDER — INSULIN ASPART 100 UNIT/ML ~~LOC~~ SOLN
12.0000 [IU] | Freq: Three times a day (TID) | SUBCUTANEOUS | Status: DC
  Administered 2016-04-12 – 2016-04-13 (×5): 12 [IU] via SUBCUTANEOUS

## 2016-04-12 MED ORDER — HYDROMORPHONE HCL 2 MG PO TABS: 1.0000 mg | ORAL_TABLET | Freq: Four times a day (QID) | ORAL | Status: DC | PRN

## 2016-04-12 MED ORDER — OXYCODONE HCL 5 MG PO TABS
30.0000 mg | ORAL_TABLET | ORAL | Status: DC | PRN
  Administered 2016-04-12 – 2016-04-13 (×6): 30 mg via ORAL
  Filled 2016-04-12 (×6): qty 6

## 2016-04-12 MED ORDER — HYDROMORPHONE HCL 1 MG/ML IJ SOLN
0.5000 mg | Freq: Four times a day (QID) | INTRAMUSCULAR | Status: DC | PRN
  Administered 2016-04-12 – 2016-04-13 (×2): 0.5 mg via INTRAVENOUS
  Filled 2016-04-12 (×4): qty 1

## 2016-04-12 MED ORDER — ALPRAZOLAM 0.5 MG PO TABS
0.5000 mg | ORAL_TABLET | Freq: Three times a day (TID) | ORAL | Status: DC | PRN
  Administered 2016-04-12 (×2): 0.5 mg via ORAL
  Filled 2016-04-12 (×2): qty 1

## 2016-04-12 MED ORDER — ACETAMINOPHEN 325 MG PO TABS: 650.0000 mg | ORAL_TABLET | Freq: Four times a day (QID) | ORAL | Status: DC | PRN

## 2016-04-12 MED ORDER — INSULIN ASPART 100 UNIT/ML ~~LOC~~ SOLN
0.0000 [IU] | Freq: Three times a day (TID) | SUBCUTANEOUS | Status: DC
  Administered 2016-04-12: 7 [IU] via SUBCUTANEOUS
  Administered 2016-04-12: 5 [IU] via SUBCUTANEOUS
  Administered 2016-04-12: 7 [IU] via SUBCUTANEOUS
  Administered 2016-04-13: 5 [IU] via SUBCUTANEOUS
  Administered 2016-04-13: 3 [IU] via SUBCUTANEOUS

## 2016-04-12 MED ORDER — HYDROMORPHONE HCL 1 MG/ML IJ SOLN
1.0000 mg | Freq: Once | INTRAMUSCULAR | Status: AC
Start: 2016-04-12 — End: 2016-04-12
  Administered 2016-04-12: 1 mg via INTRAVENOUS

## 2016-04-12 MED ORDER — GABAPENTIN 400 MG PO CAPS
800.0000 mg | ORAL_CAPSULE | Freq: Every day | ORAL | Status: DC
  Administered 2016-04-12: 800 mg via ORAL
  Filled 2016-04-12: qty 2

## 2016-04-12 MED ORDER — HYDROMORPHONE HCL 1 MG/ML IJ SOLN
1.0000 mg | INTRAMUSCULAR | Status: DC | PRN
  Administered 2016-04-12 (×2): 1 mg via INTRAVENOUS
  Filled 2016-04-12 (×3): qty 1

## 2016-04-12 MED ORDER — ACETAMINOPHEN 650 MG RE SUPP: 650.0000 mg | Freq: Four times a day (QID) | RECTAL | Status: DC | PRN

## 2016-04-12 MED ORDER — INSULIN GLARGINE 100 UNIT/ML ~~LOC~~ SOLN
60.0000 [IU] | Freq: Two times a day (BID) | SUBCUTANEOUS | Status: DC
  Administered 2016-04-12 – 2016-04-13 (×3): 60 [IU] via SUBCUTANEOUS
  Filled 2016-04-12 (×5): qty 0.6

## 2016-04-12 MED ORDER — GABAPENTIN 400 MG PO CAPS
400.0000 mg | ORAL_CAPSULE | Freq: Two times a day (BID) | ORAL | Status: DC
  Administered 2016-04-12 – 2016-04-13 (×2): 400 mg via ORAL
  Filled 2016-04-12 (×3): qty 1

## 2016-04-12 MED ORDER — ONDANSETRON HCL 4 MG/2ML IJ SOLN: 4.0000 mg | Freq: Four times a day (QID) | INTRAMUSCULAR | Status: DC | PRN

## 2016-04-12 MED ORDER — HYDROMORPHONE HCL 1 MG/ML IJ SOLN
0.5000 mg | Freq: Once | INTRAMUSCULAR | Status: AC
  Administered 2016-04-12: 0.5 mg via INTRAVENOUS
  Filled 2016-04-12: qty 1

## 2016-04-12 MED ORDER — HEPARIN (PORCINE) IN NACL 100-0.45 UNIT/ML-% IJ SOLN
1800.0000 [IU]/h | INTRAMUSCULAR | Status: DC
  Filled 2016-04-12 (×2): qty 250

## 2016-04-12 MED ORDER — IOPAMIDOL (ISOVUE-370) INJECTION 76%
INTRAVENOUS | Status: AC
  Filled 2016-04-12: qty 100

## 2016-04-12 NOTE — Progress Notes (Signed)
Pt arrived to the unit refused to have chg wipes done would not take T shirt off. Stated he would not wear telemetry all day.  He was complaining of pain, med's not due until 0800. Refusing to let us start heparin Gtt until he has more pain meds.  I explained to pt that he had a large blood clot in his lung and the heparin was vitally important . Pt still refused I paged Dr Burnett KanarisKackrakandy to notify of above he ordered a 1 time dose of dilaudid.  Pt still refused to let us run his heparin gtt until he gets second iv started.  Iv team came to room and pt refused to let them start an iv until his pain medicine took effect.  Again I explained to pt the importance of his heparin gtt he is still being non- compliant,  Dr Burnett KanarisKackrakandy  aware

## 2016-04-12 NOTE — Progress Notes (Signed)
Pt refusing to abide by carb modified diet and has exceeded carb count for meals.

## 2016-04-12 NOTE — H&P (Signed)
History and Physical    Harry Smith ZOX:[id number] DOB: 19-Apr-1976 DOA: 04/11/2016  PCP: No PCP Per Patient  Patient coming from: Home   Chief Complaint: Chest pain   HPI: Harry Smith is a 40 y.o. male with a past medical hx of DM type II,who was recently treated for right foot infection, he was placed on zosyn and vancomycin and was on PICC line was also diagnosed with right upper extremity DVT last week at [place], [place] and was placed on xarelto presents to the ER with complaints of pleuritic-type of chest pain and shortness of breath since yesterday afternoon. Denies any associated fever chills had some productive cough which patient states is chronic. CT angiogram revealed a pulmonary embolism with heart strain but the patient appears hemodynamically stable. He has a right foot amputation with ulceration no active discharge on physical exam. ED Physician started him on IV heparin and pain medications. Admitted for further evaluation. Patient also states she has had a previous history of PE last year when he had a left upper extremity PICC line placed.   ED Course: Chest CT reveled a pulmonary embolus within pulmonary artery branches to the right upper lobe, right lower lobe and left lower lobe. Glucose 397, creatinine 1.39, sodium 132. He was started on IV heparin and pain management.   Review of Systems: As per HPI, rest all negative.   Past Medical History:  Diagnosis Date  . Diabetes mellitus without complication [facility])     Past Surgical History:  Procedure Laterality Date  . APPENDECTOMY    . BACK SURGERY    . CHOLECYSTECTOMY       reports that he has never smoked. His smokeless tobacco use includes Chew. He reports that he does not drink alcohol or use drugs.  Allergies  Allergen Reactions  . Tramadol Nausea Only  . Ketorolac Tromethamine Nausea And Vomiting and Rash  . Morphine And Related Rash  . Suboxone  [Buprenorphine Hcl-Naloxone Hcl] Rash    Rash  after injection in leg    No family history on file.  Prior to Admission medications   Medication Sig Start Date End Date Taking? Authorizing Provider  apixaban (ELIQUIS) 5 MG TABS tablet Take 5 mg by mouth daily.    Historical Provider, MD  cephALEXin (KEFLEX) 500 MG capsule Take 1 capsule (500 mg total) by mouth 4 (four) times daily. 01/05/16   Lavera Guise, MD  gabapentin (NEURONTIN) 400 MG capsule Take 400 mg by mouth 2 (two) times daily. And then take 2 capsules at bedtime.    Historical Provider, MD  insulin aspart (NOVOLOG) 100 UNIT/ML injection Inject 12 Units into the skin 3 (three) times daily before meals. And then sliding scale as needed.    Historical Provider, MD  insulin glargine (LANTUS) 100 UNIT/ML injection Inject 60 Units into the skin 2 (two) times daily.    Historical Provider, MD    Physical Exam: Vitals:   04/11/16 2252 04/12/16 0028 04/12/16 0030 04/12/16 0144  BP: 155/90 167/80 167/80 163/84  Pulse: 104 87 90 87  Resp: 18 16 16 15   Temp: 98.5 F (36.9 C)     TempSrc: Oral     SpO2: 100% 98% 94% 100%  Weight: 113.4 kg (250 lb)     Height: 6\' 2"  (1.88 m)        Vitals:   04/11/16 2252 04/12/16 0028 04/12/16 0030 04/12/16 0144  BP: 155/90 167/80 167/80 163/84  Pulse: 104 87 90 87  Resp: 18 16 16 15   Temp: 98.5 F (36.9 C)     TempSrc: Oral     SpO2: 100% 98% 94% 100%  Weight: 113.4 kg (250 lb)     Height: 6\' 2"  (1.88 m)      Constitutional: Appears calm and comfortable, well built And nourished Eyes: Anicteric, no pallor  ENMT: No discharge from the ears, eyes, nose, and mouth  Neck: No JVD, no neck rigidity Respiratory: CTA, no wheezes or rhonchi Cardiovascular: No murmurs, rubs, or gallops  Abdomen: Soft, non tender, and non distended  Musculoskeletal:  Right foot transmetatarsal amputation  Skin: Laceration on the right foot measuring in 1 centimeter in diameter, no active discharge. Multiple tattoos. Neurological: CN 2-12 grossly  intact Psychiatric: Judgement and insight intact, oriented x3.    Labs on Admission: I have personally reviewed following labs and imaging studies  CBC:  Recent Labs Lab 04/11/16 2302  WBC 9.2  HGB 13.0  HCT 40.0  MCV 88.5  PLT 315   Basic Metabolic Panel:  Recent Labs Lab 04/11/16 2302  NA 132*  K 4.2  CL 95*  CO2 25  GLUCOSE 397*  BUN 22*  CREATININE 1.39*  CALCIUM 9.7   GFR: Estimated Creatinine Clearance: 95.6 mL/min (by C-G formula based on SCr of 1.39 mg/dL (H)). Liver Function Tests: No results for input(s): AST, ALT, ALKPHOS, BILITOT, PROT, ALBUMIN in the last 168 hours. No results for input(s): LIPASE, AMYLASE in the last 168 hours. No results for input(s): AMMONIA in the last 168 hours. Coagulation Profile:  Recent Labs Lab 04/11/16 2302  INR 1.07   Cardiac Enzymes: No results for input(s): CKTOTAL, CKMB, CKMBINDEX, TROPONINI in the last 168 hours. BNP (last 3 results) No results for input(s): PROBNP in the last 8760 hours. HbA1C: No results for input(s): HGBA1C in the last 72 hours. CBG: No results for input(s): GLUCAP in the last 168 hours. Lipid Profile: No results for input(s): CHOL, HDL, LDLCALC, TRIG, CHOLHDL, LDLDIRECT in the last 72 hours. Thyroid Function Tests: No results for input(s): TSH, T4TOTAL, FREET4, T3FREE, THYROIDAB in the last 72 hours. Anemia Panel: No results for input(s): VITAMINB12, FOLATE, FERRITIN, TIBC, IRON, RETICCTPCT in the last 72 hours. Urine analysis:    Component Value Date/Time   COLORURINE YELLOW 11/02/2013 0727   APPEARANCEUR CLEAR 11/02/2013 0727   LABSPEC 1.010 11/02/2013 0727   PHURINE 5.5 11/02/2013 0727   GLUCOSEU >1000 (A) 11/02/2013 0727   HGBUR TRACE (A) 11/02/2013 0727   BILIRUBINUR NEGATIVE 11/02/2013 0727   KETONESUR NEGATIVE 11/02/2013 0727   PROTEINUR NEGATIVE 11/02/2013 0727   UROBILINOGEN 0.2 11/02/2013 0727   NITRITE NEGATIVE 11/02/2013 0727   LEUKOCYTESUR NEGATIVE 11/02/2013 0727    Sepsis Labs: @LABRCNTIP (procalcitonin:4,lacticidven:4) )No results found for this or any previous visit (from the past 240 hour(s)).   Radiological Exams on Admission: Ct Angio Chest Pe W Or Wo Contrast  Result Date: 04/12/2016 CLINICAL DATA:  Acute onset of mid chest pain, radiating to the back. Initial encounter. EXAM: CT ANGIOGRAPHY CHEST WITH CONTRAST TECHNIQUE: Multidetector CT imaging of the chest was performed using the standard protocol during bolus administration of intravenous contrast. Multiplanar CT image reconstructions and MIPs were obtained to evaluate the vascular anatomy. CONTRAST:  100 mL of Isovue 370 IV contrast COMPARISON:  Chest radiograph performed 04/11/2016 FINDINGS: Cardiovascular: Pulmonary embolus is noted within pulmonary arteries to the right upper lobe, right lower lobe and left lower lobe. The RV/LV ratio of 1.07 is concerning for right heart strain  and submassive pulmonary embolus. The heart is grossly unremarkable in appearance. The thoracic aorta appears intact. The great vessels are unremarkable in appearance. Mediastinum/Nodes: The mediastinum is grossly unremarkable. No mediastinal lymphadenopathy is seen. No pericardial effusion is identified. The thyroid gland is unremarkable in appearance. No axillary lymphadenopathy is appreciated. Lungs/Pleura: A vague mosaic pattern of parenchymal attenuation is noted bilaterally, which may reflect a variety of processes, including atelectasis, mild interstitial edema or infection. No pleural effusion or pneumothorax is seen. No masses are identified. Upper Abdomen: The visualized portions of the liver and spleen are unremarkable. The patient is status post cholecystectomy, with clips noted at the gallbladder fossa. The visualized portions of the pancreas, adrenal glands and kidneys are within normal limits. Musculoskeletal: No acute osseous abnormalities are identified. The visualized musculature is unremarkable in appearance.  Review of the MIP images confirms the above findings. IMPRESSION: 1. Pulmonary embolus within pulmonary artery branches to the right upper lobe, right lower lobe and left lower lobe. CT evidence of right heart strain (RV/LV Ratio = 1.07) consistent with at least submassive (intermediate risk) PE. The presence of right heart strain has been associated with an increased risk of morbidity and mortality. Please activate Code PE by paging [phone]. 2. Vague mosaic pattern of parenchymal attenuation noted bilaterally, which may reflect a variety of processes, including atelectasis, mild interstitial edema or infection. Critical Value/emergent results were called by telephone at the time of interpretation on 04/12/2016 at 1:14 am to Dr. Tomasita Crumble, who verbally acknowledged these results. Electronically Signed   By: Roanna Raider M.D.   On: 04/12/2016 01:15   Dg Chest Portable 1 View  Result Date: 04/12/2016 CLINICAL DATA:  Initial value a acute chest pain, shortness of breath. EXAM: PORTABLE CHEST 1 VIEW COMPARISON:  None available. FINDINGS: Cardiac mediastinal silhouettes are within normal limits. Lungs are normally inflated. No focal infiltrates. No pulmonary edema or pleural effusion. No pneumothorax. No acute osseous abnormality. IMPRESSION: No active disease. Electronically Signed   By: Rise Mu M.D.   On: 04/12/2016 00:15    EKG: Independently reviewed. EKG shows normal sinus rhythm   Assessment/Plan Active Problems:   * No active hospital problems. *    1. PE/DVT - Probably provoked from recent PICC line of right upper extremity DVT.  He appears to be hemodynamically stable. Will check 2D echocardiogram and cycle cardiac markers. Will obtain BNP and continue IV Heparin for now.Since the patient is only on xarelto and developed PE, will consult hematology in a.m. for long-term anticoagulation.  2. DM type 2  - Uncontrolled. Not sure of compliance. For now we will continue home  regimen including home dose now of Lantus and NovoLog with sliding scale coverage. Follow metabolic panel. 3. Right foot stump infection - patient states he was on vancomycin and Zosyn which is to be continued until October 19. Will need to get records from [facility] where patient was treated. For now will get pharmacy to dose. 4. Chronic pain - Patient has a history of pain seeking behavior. Will need to confirm home dose regimen. He states he takes 30mg  of oxycodone every 4 hours as needed. Needs to be verified. Continue gabapentin. 5. AKI - Not sure of the cause. Will hydrate and recheck metabolic panel. Check UA.    I have reviewed patient's old charts in Care everywhere. Requested records from [facility].  DVT prophylaxis: Heparin. Code Status: Full  Family Communication: No family bedside Disposition Plan: Discharge home once  improved.  Consults called: Hematology  Admission status: Inpatient    Queen SloughArshad Karkandy, MD  [facility] If 7PM-7AM, please contact night-coverage [email or web address] Password [email or web address]  04/12/2016, 1:45 AM     By signing my name below, I, Cynda AcresHailei Fulton, attest that this documentation has been prepared under the direction and in the presence of Midge MiniumArshad Rexine Gowens, MD. Electronically signed: Cynda AcresHailei Fulton, Scribe. 04/12/16 3:35 PM

## 2016-04-12 NOTE — ED Notes (Signed)
Pt refused 2nd IV placement. 

## 2016-04-12 NOTE — ED Notes (Addendum)
Pt reports that the EKG stickers were irritating him and that he did not want to wear them anymore. Educated pt on need for continuous monitoring in ED.

## 2016-04-12 NOTE — Progress Notes (Signed)
Patient continues to refuse telemetry monitoring and and my full assessment this morning, IV heparin drip that was ordered at 0400 has still not been started due to limited IV access, patient has refused IV team to establish access for the third time and has now removed his only IV access saying "if my diet is not changed I will leave', Education given regarding vital need of heparin drip and potential harm if patient leaves prematurely. Dr. Ella JubileeArrien notified.

## 2016-04-12 NOTE — Progress Notes (Signed)
Pt pulled leads off to go to the bathroom without call once pt got back from bathroom he refused to let us put him back explained importance pt still refused

## 2016-04-12 NOTE — Progress Notes (Signed)
PROGRESS NOTE    Harry SeniorRobert S Smith  ZOX:[id number] DOB: 11/26/1975 DOA: 04/11/2016 PCP: No PCP Per Patient    Brief Narrative: 40 yo male with T2DM, recently treated for right foot infection with IV antibiotics with right upper extremity PICC, developed DVT right upper extremity, last week, placed on rivaroxaban. Presents with severe pleuritic chest pain and dyspnea. CT chest noted bilateral pulmonary embolism with signs of RV strain. Placed on full anticoagulation heparin and ordered echocardiogram. Patient had DVT and PE last year associated with left upper extremity DVT.   Assessment & Plan:   Principal Problem:   Other pulmonary embolism without acute cor pulmonale (HCC) Active Problems:   Type 2 diabetes mellitus with hyperglycemia (HCC)   1. Acute bilateral pulmonary embolism/ suspected submassive. Will continue full dose anticoagulation with enoxapain, patient pulled IV access. Follow on echocardiogram. Blood pressure stable. RV strain per CT report. Patient has been on rivaroxaban, per his report taking medication as instructed. Consulted hematology in regards of further anticoagulation. Patient complains of significant pleuritic chest pain, requesting narcotics.  2. T2DM. Will continue diabetic diet, insulin sliding scale for glucose cover and monitoring. Basal insulin with 60 units bid glargine and pre-meal 12 units.   3. Chronic pain syndrome. Will resume patient's home dose of oxycodone and will add hydromorphone as needed. Alprazolam added for anxiety. Continue gabapentin.  Serum glucose [phone]  4. AKI. Renal function has improved, cr down to 1.0 from 1.39, will avoid nephrotoxic agents or hypotension, follow renal panel in am.    DVT prophylaxis:  Lovenox full dose Code Status: Full  Family Communication: No family at the bedside Disposition Plan: home   Consultants:   Hematology   Procedures:    Antimicrobials:   Subjective: Patient reports significant  pleuritic chest pain, moderate to severe, no improvement factors and worse with deep inspiration. No nausea or vomiting.   Objective: Vitals:   04/12/16 0324 04/12/16 0409 04/12/16 0636 04/12/16 0800  BP: 177/78 184/93 (!) 178/87   Pulse: 94 87 81   Resp: 16 16 16    Temp:   98.5 F (36.9 C) 98.4 F (36.9 C)  TempSrc:   Oral Oral  SpO2: 100% 99% 95%   Weight:      Height:        Intake/Output Summary (Last 24 hours) at 04/12/16 [id number] Last data filed at 04/12/16 0034  Gross per 24 hour  Intake              800 ml  Output                0 ml  Net              800 ml   Filed Weights   04/11/16 2252  Weight: 113.4 kg (250 lb)    Examination:  General exam: Not in pain or dyspnea E ENT: no conjunctival pallor, oral mucosa moist.  Respiratory system: Clear to auscultation. Respiratory effort normal. Mild decreased breath sounds at bases, no wheezing rales or rhonchi.  Cardiovascular system: S1 & S2 heard, RRR. No JVD, murmurs, rubs, gallops or clicks. No pedal edema. Gastrointestinal system: Abdomen is nondistended, soft and nontender. No organomegaly or masses felt. Normal bowel sounds heard. Central nervous system: Alert and oriented. No focal neurological deficits. Extremities: Symmetric 5 x 5 power. Right foot transmetatarsal amputation.  Skin: No rashes, lesions or ulcers.     Data Reviewed: I have personally reviewed following labs and imaging studies  CBC:  Recent Labs Lab 04/11/16 2302 04/12/16 0400  WBC 9.2 9.5  HGB 13.0 12.2*  HCT 40.0 37.6*  MCV 88.5 87.9  PLT 315 246   Basic Metabolic Panel:  Recent Labs Lab 04/11/16 2302 04/12/16 0400  NA 132* 132*  K 4.2 4.0  CL 95* 98*  CO2 25 24  GLUCOSE 397* 326*  BUN 22* 20  CREATININE 1.39* 1.09  CALCIUM 9.7 9.1   GFR: Estimated Creatinine Clearance: 121.9 mL/min (by C-G formula based on SCr of 1.09 mg/dL). Liver Function Tests: No results for input(s): AST, ALT, ALKPHOS, BILITOT, PROT, ALBUMIN in  the last 168 hours. No results for input(s): LIPASE, AMYLASE in the last 168 hours. No results for input(s): AMMONIA in the last 168 hours. Coagulation Profile:  Recent Labs Lab 04/11/16 2302  INR 1.07   Cardiac Enzymes:  Recent Labs Lab 04/12/16 0400  TROPONINI <0.03   BNP (last 3 results) No results for input(s): PROBNP in the last 8760 hours. HbA1C: No results for input(s): HGBA1C in the last 72 hours. CBG:  Recent Labs Lab 04/12/16 0621  GLUCAP 310*   Lipid Profile: No results for input(s): CHOL, HDL, LDLCALC, TRIG, CHOLHDL, LDLDIRECT in the last 72 hours. Thyroid Function Tests: No results for input(s): TSH, T4TOTAL, FREET4, T3FREE, THYROIDAB in the last 72 hours. Anemia Panel: No results for input(s): VITAMINB12, FOLATE, FERRITIN, TIBC, IRON, RETICCTPCT in the last 72 hours. Sepsis Labs: No results for input(s): PROCALCITON, LATICACIDVEN in the last 168 hours.  Recent Results (from the past 240 hour(s))  MRSA PCR Screening     Status: None   Collection Time: 04/12/16  6:52 AM  Result Value Ref Range Status   MRSA by PCR NEGATIVE NEGATIVE Final    Comment:        The GeneXpert MRSA Assay (FDA approved for NASAL specimens only), is one component of a comprehensive MRSA colonization surveillance program. It is not intended to diagnose MRSA infection nor to guide or monitor treatment for MRSA infections.          Radiology Studies: Ct Angio Chest Pe W Or Wo Contrast  Result Date: 04/12/2016 CLINICAL DATA:  Acute onset of mid chest pain, radiating to the back. Initial encounter. EXAM: CT ANGIOGRAPHY CHEST WITH CONTRAST TECHNIQUE: Multidetector CT imaging of the chest was performed using the standard protocol during bolus administration of intravenous contrast. Multiplanar CT image reconstructions and MIPs were obtained to evaluate the vascular anatomy. CONTRAST:  100 mL of Isovue 370 IV contrast COMPARISON:  Chest radiograph performed 04/11/2016  FINDINGS: Cardiovascular: Pulmonary embolus is noted within pulmonary arteries to the right upper lobe, right lower lobe and left lower lobe. The RV/LV ratio of 1.07 is concerning for right heart strain and submassive pulmonary embolus. The heart is grossly unremarkable in appearance. The thoracic aorta appears intact. The great vessels are unremarkable in appearance. Mediastinum/Nodes: The mediastinum is grossly unremarkable. No mediastinal lymphadenopathy is seen. No pericardial effusion is identified. The thyroid gland is unremarkable in appearance. No axillary lymphadenopathy is appreciated. Lungs/Pleura: A vague mosaic pattern of parenchymal attenuation is noted bilaterally, which may reflect a variety of processes, including atelectasis, mild interstitial edema or infection. No pleural effusion or pneumothorax is seen. No masses are identified. Upper Abdomen: The visualized portions of the liver and spleen are unremarkable. The patient is status post cholecystectomy, with clips noted at the gallbladder fossa. The visualized portions of the pancreas, adrenal glands and kidneys are within normal limits. Musculoskeletal: No acute osseous abnormalities are  identified. The visualized musculature is unremarkable in appearance. Review of the MIP images confirms the above findings. IMPRESSION: 1. Pulmonary embolus within pulmonary artery branches to the right upper lobe, right lower lobe and left lower lobe. CT evidence of right heart strain (RV/LV Ratio = 1.07) consistent with at least submassive (intermediate risk) PE. The presence of right heart strain has been associated with an increased risk of morbidity and mortality. Please activate Code PE by paging [phone]. 2. Vague mosaic pattern of parenchymal attenuation noted bilaterally, which may reflect a variety of processes, including atelectasis, mild interstitial edema or infection. Critical Value/emergent results were called by telephone at the time of  interpretation on 04/12/2016 at 1:14 am to Dr. Tomasita Crumble, who verbally acknowledged these results. Electronically Signed   By: Roanna Raider M.D.   On: 04/12/2016 01:15   Dg Chest Portable 1 View  Result Date: 04/12/2016 CLINICAL DATA:  Initial value a acute chest pain, shortness of breath. EXAM: PORTABLE CHEST 1 VIEW COMPARISON:  None available. FINDINGS: Cardiac mediastinal silhouettes are within normal limits. Lungs are normally inflated. No focal infiltrates. No pulmonary edema or pleural effusion. No pneumothorax. No acute osseous abnormality. IMPRESSION: No active disease. Electronically Signed   By: Rise Mu M.D.   On: 04/12/2016 00:15        Scheduled Meds: . gabapentin  400 mg Oral BID  . gabapentin  800 mg Oral QHS  . insulin aspart  0-9 Units Subcutaneous TID WC  . insulin aspart  12 Units Subcutaneous TID AC  . insulin glargine  60 Units Subcutaneous BID  . iopamidol       Continuous Infusions:    LOS: 0 days        Mauricio Annett Gula, MD [facility] Pager [phone]  If 7PM-7AM, please contact night-coverage [email or web address] Password [email or web address] 04/12/2016, 9:27 AM

## 2016-04-12 NOTE — Progress Notes (Signed)
Iv Team  Came for a second time pt argumentative with staff about how to place iv's IV team left and pt still does not have heparin. Continue to encourage pt pt still non compliant

## 2016-04-12 NOTE — Progress Notes (Addendum)
ANTICOAGULATION CONSULT NOTE - Initial Consult  Pharmacy Consult for Heparin Indication: pulmonary embolus  Allergies  Allergen Reactions  . Tramadol Nausea Only  . Ketorolac Tromethamine Nausea And Vomiting and Rash  . Morphine And Related Rash  . Suboxone  [Buprenorphine Hcl-Naloxone Hcl] Rash    Rash after injection in leg    Patient Measurements: Height: 6\' 2"  (188 cm) Weight: 250 lb (113.4 kg) IBW/kg (Calculated) : 82.2 Heparin Dosing Weight: 105 kg  Vital Signs: Temp: 98.5 F (36.9 C) (10/09 2252) Temp Source: Oral (10/09 2252) BP: 177/78 (10/10 0324) Pulse Rate: 94 (10/10 0324)  Labs:  Recent Labs  04/11/16 2302  HGB 13.0  HCT 40.0  PLT 315  LABPROT 13.9  INR 1.07  CREATININE 1.39*    Estimated Creatinine Clearance: 95.6 mL/min (by C-G formula based on SCr of 1.39 mg/dL (H)).   Medical History: Past Medical History:  Diagnosis Date  . Diabetes mellitus without complication (HCC)     Medications:  Awaiting electronic med rec  Assessment: 40 y.o. M presents with SOB and CP. CT shows pulmonary embolus within pulmonary artery branches to the right upper lobe, right lower lobe and left lower lobe. CT evidence of right heart strain (RV/LV Ratio = 1.07) consistent with at least submassive (intermediate risk) PE. Pt states he was taking Xarelto at home with last dose taken 10/8 am. To begin heparin for new PE.  Xarelto likely still affecting heparin level so will utilitize PTT for monitoring heparin for now.  Goal of Therapy:  Heparin level 0.3-0.7 units/ml; PTT 66-102 sec Monitor platelets by anticoagulation protocol: Yes   Plan:  Baseline heparin level as pt on Xarelto PTA Heparin IV bolus 6500 units Heparin gtt at 1800 units/hr Will f/u PTT in 6 hours Daily heparin level, PTT, and CBC  Christoper Fabianaron Mercedees Convery, PharmD, BCPS Clinical pharmacist, pager [phone] 04/12/2016,4:01 AM   Addendum: Baseline heparin level undetectable and PTT 30 sec. INR  1.07.  Plan: Will utilize heparin levels for heparin monitoring  Christoper Fabianaron Sophie Tamez, PharmD, BCPS Clinical pharmacist, pager [phone] 04/12/2016 4:58 AM

## 2016-04-12 NOTE — Progress Notes (Signed)
ANTICOAGULATION CONSULT NOTE - Initial Consult  Pharmacy Consult for Lovenox Indication: pulmonary embolus  Allergies  Allergen Reactions  . Tramadol Nausea Only  . Ketorolac Tromethamine Nausea And Vomiting and Rash  . Morphine And Related Rash  . Suboxone  [Buprenorphine Hcl-Naloxone Hcl] Rash    Rash after injection in leg    Patient Measurements: Height: 6\' 2"  (188 cm) Weight: 250 lb (113.4 kg) IBW/kg (Calculated) : 82.2  Vital Signs: Temp: 98.4 F (36.9 C) (10/10 0800) Temp Source: Oral (10/10 0800) BP: 178/87 (10/10 0636) Pulse Rate: 81 (10/10 0636)  Labs:  Recent Labs  04/11/16 2302 04/12/16 0400  HGB 13.0 12.2*  HCT 40.0 37.6*  PLT 315 246  APTT  --  30  LABPROT 13.9  --   INR 1.07  --   HEPARINUNFRC  --  <0.10*  CREATININE 1.39* 1.09  TROPONINI  --  <0.03    Estimated Creatinine Clearance: 121.9 mL/min (by C-G formula based on SCr of 1.09 mg/dL).   Medical History: Past Medical History:  Diagnosis Date  . Diabetes mellitus without complication (HCC)   . DVT (deep venous thrombosis) (HCC) 04/03/2016  . Rotator cuff dysfunction, left     Medications:  Scheduled:  . enoxaparin (LOVENOX) injection  1 mg/kg Subcutaneous Q12H  . gabapentin  400 mg Oral BID  . gabapentin  800 mg Oral QHS  . insulin aspart  0-9 Units Subcutaneous TID WC  . insulin aspart  12 Units Subcutaneous TID AC  . insulin glargine  60 Units Subcutaneous BID  . iopamidol       Infusions:    Assessment: 40 yo M initially ordered heparin for new PE with right heart strain.  Heparin was not started due to limited IV access.  Now to switch patient to Lovenox.  Goal of Therapy:  Anti-Xa level 0.6-1 units/ml 4hrs after LMWH dose given Monitor platelets by anticoagulation protocol: Yes   Plan:  Lovenox 1mg /kg SQ q12h - dose = 115 mg CBC q72 hours  On Xarelto PTA for RUE DVT diagnosed last week at outside hospital. Follow-up plans for restarting oral anticoagulation.    Toys 'R' UsKimberly Arrie Borrelli, Pharm.D., BCPS Clinical Pharmacist Pager [phone] 04/12/2016 10:54 AM

## 2016-04-12 NOTE — ED Notes (Signed)
Dr. Kakrakandy at the bedside.  

## 2016-04-12 NOTE — ED Notes (Signed)
Pt encouraged to wear continuous monitor. Pt refused to keep continuous monitoring on. Pt educated about importance of continuous monitoring.

## 2016-04-12 NOTE — ED Notes (Signed)
Pt VS updated. Pt refuses to wear continuous monitor. Pt educated about importance of continuous monitoring. Pt continues to refuse.

## 2016-04-12 NOTE — ED Notes (Signed)
Attempted IV x 2 with no success. 

## 2016-04-12 NOTE — Progress Notes (Signed)
0705:waited for patient to return from bathroom . He refuses an IV in his Hand,refuses upper arm because he says he has a clot there, and can not rotate his arm.Will have day I.V. Nurse to evaluate.BSmithRN

## 2016-04-12 NOTE — Consult Note (Signed)
[facility]  [phone]   HEMATOLOGY ONCOLOGY INPATIENT CONSULTATION   Harry Smith  DOB: 10-06-1975  MR#: [id number]  CSN#: [id number]    Requesting Physician: [facility]  Patient Care Team: No Pcp Per Patient as PCP - General (General Practice)  Reason for consult:  Recurrent DVT/PE  History of present illness:   Harry Smith is a 40 y.o. male with a past medical hx of uncontrolled DM type II,who was recently treated for right foot infection after amputation in April. A PICC line was placed in his right arm for antibiotics, and he developed right upper extremity DVT. He will placed on several to 50 mg twice daily about 2 weeks ago, PICC line was removed. His right upper arm swelling resolved. However he developed chest pain and shortness of breath yesterday, and presented to [facility] room. CT scan reviewed multiple bilateral segmental PE, and the right heart strain. He was put on heparin drip and was admitted. He had a very similar episode of DVT and PE last year, he developed left upper extremity DVT after PICC line placement for IV antibiotics, was put on Catapres, and developed left PE 2-3 weeks later. He was on Adipost for 3 months, and came off afterwards. He denies any prior history of thrombosis, his family history is on no because he was not adopted. History of noncompliance, he was recent discharged from his local infection disease doctor due to [facility].    MEDICAL HISTORY:  Past Medical History:  Diagnosis Date  . Diabetes mellitus without complication (HCC)   . DVT (deep venous thrombosis) (HCC) 04/03/2016  . Rotator cuff dysfunction, left     SURGICAL HISTORY: Past Surgical History:  Procedure Laterality Date  . AMPUTATION TOE Right 10/19/2015  . APPENDECTOMY    . BACK SURGERY    . CHOLECYSTECTOMY      SOCIAL HISTORY: Social History   Social History  . Marital status: Divorced    Spouse name: N/A  . Number of  children: N/A  . Years of education: N/A   Occupational History  . Disability     Social History Main Topics  . Smoking status: Former Smoker    Types: Cigarettes  . Smokeless tobacco: Current User    Types: Chew  . Alcohol use No  . Drug use:     Types: Oxycodone  . Sexual activity: Not Currently    Partners: Female   Other Topics Concern  . Not on file   Social History Narrative  . No narrative on file    FAMILY HISTORY: Family History  Problem Relation Age of Onset  . Adopted: Yes  . Pulmonary embolism Neg Hx     ALLERGIES:  is allergic to tramadol; ketorolac tromethamine; morphine and related; and suboxone  [buprenorphine hcl-naloxone hcl].  MEDICATIONS:  Current Facility-Administered Medications  Medication Dose Route Frequency Provider Last Rate Last Dose  . acetaminophen (TYLENOL) tablet 650 mg  650 mg Oral Q6H PRN Eduard Clos, MD       Or  . acetaminophen (TYLENOL) suppository 650 mg  650 mg Rectal Q6H PRN Eduard Clos, MD      . ALPRAZolam Prudy Feeler) tablet 0.5 mg  0.5 mg Oral TID PRN Coralie Keens, MD   0.5 mg at 04/12/16 1023  . enoxaparin (LOVENOX) injection 115 mg  1 mg/kg Subcutaneous Q12H Kimberly B Hammons, RPH   115 mg at 04/12/16 1326  . gabapentin (NEURONTIN) capsule 400 mg  400 mg Oral BID Eduard Clos, MD   400 mg at 04/12/16 1015  . gabapentin (NEURONTIN) capsule 800 mg  800 mg Oral QHS Eduard Clos, MD      . HYDROmorphone (DILAUDID) tablet 1 mg  1 mg Oral Q6H PRN Coralie Keens, MD      . insulin aspart (novoLOG) injection 0-9 Units  0-9 Units Subcutaneous TID WC Eduard Clos, MD   7 Units at 04/12/16 1325  . insulin aspart (novoLOG) injection 12 Units  12 Units Subcutaneous TID AC Eduard Clos, MD   12 Units at 04/12/16 1325  . insulin glargine (LANTUS) injection 60 Units  60 Units Subcutaneous BID Eduard Clos, MD   60 Units at 04/12/16 1326  . ondansetron (ZOFRAN) tablet 4 mg  4  mg Oral Q6H PRN Eduard Clos, MD       Or  . ondansetron [facility]) injection 4 mg  4 mg Intravenous Q6H PRN Eduard Clos, MD      . oxyCODONE (Oxy IR/ROXICODONE) immediate release tablet 30 mg  30 mg Oral Q4H PRN Coralie Keens, MD   30 mg at 04/12/16 1023    REVIEW OF SYSTEMS:   Constitutional: Denies fevers, chills or abnormal night sweats Eyes: Denies blurriness of vision, double vision or watery eyes Ears, nose, mouth, throat, and face: Denies mucositis or sore throat Respiratory: Denies cough, dyspnea or wheezes Cardiovascular: Denies palpitation, chest discomfort or lower extremity swelling Gastrointestinal:  Denies nausea, heartburn or change in bowel habits Skin: Denies abnormal skin rashes Lymphatics: Denies new lymphadenopathy or easy bruising Neurological:Denies numbness, tingling or new weaknesses Behavioral/Psych: Mood is stable, no new changes  All other systems were reviewed with the patient and are negative.  PHYSICAL EXAMINATION: ECOG PERFORMANCE STATUS: 1 - Symptomatic but completely ambulatory  Vitals:   04/12/16 0800 04/12/16 1100  BP:    Pulse:    Resp:    Temp: 98.4 F (36.9 C) 98.7 F (37.1 C)   Filed Weights   04/11/16 2252  Weight: 250 lb (113.4 kg)    GENERAL:Very sleepy, no distress SKIN: skin color, texture, turgor are normal, no rashes or significant lesions EYES: normal, conjunctiva are pink and non-injected, sclera clear OROPHARYNX:no exudate, no erythema and lips, buccal mucosa, and tongue normal  NECK: supple, thyroid normal size, non-tender, without nodularity LYMPH:  no palpable lymphadenopathy in the cervical, axillary or inguinal LUNGS: clear to auscultation and percussion with normal breathing effort HEART: regular rate & rhythm and no murmurs and no lower extremity edema ABDOMEN:abdomen soft, non-tender and normal bowel sounds Musculoskeletal:no cyanosis of digits and no clubbing, right foot transmetatarsal  amputation, an small healing ulcer at front bottom, no surrounding skin erythema  PSYCH: alert & oriented x 3 with fluent speech NEURO: no focal motor/sensory deficits  LABORATORY DATA:  I have reviewed the data as listed Lab Results  Component Value Date   WBC 9.5 04/12/2016   HGB 12.2 (L) 04/12/2016   HCT 37.6 (L) 04/12/2016   MCV 87.9 04/12/2016   PLT 246 04/12/2016    Recent Labs  01/05/16 0810 04/11/16 2302 04/12/16 0400  NA 129* 132* 132*  K 4.4 4.2 4.0  CL 98* 95* 98*  CO2 22 25 24   GLUCOSE 431* 397* 326*  BUN 30* 22* 20  CREATININE 1.11 1.39* 1.09  CALCIUM 9.3 9.7 9.1  GFRNONAA >60 >60 >60  GFRAA >60 >60 >60    RADIOGRAPHIC STUDIES: I have personally reviewed  the radiological images as listed and agreed with the findings in the report. Ct Angio Chest Pe W Or Wo Contrast  Result Date: 04/12/2016 CLINICAL DATA:  Acute onset of mid chest pain, radiating to the back. Initial encounter. EXAM: CT ANGIOGRAPHY CHEST WITH CONTRAST TECHNIQUE: Multidetector CT imaging of the chest was performed using the standard protocol during bolus administration of intravenous contrast. Multiplanar CT image reconstructions and MIPs were obtained to evaluate the vascular anatomy. CONTRAST:  100 mL of Isovue 370 IV contrast COMPARISON:  Chest radiograph performed 04/11/2016 FINDINGS: Cardiovascular: Pulmonary embolus is noted within pulmonary arteries to the right upper lobe, right lower lobe and left lower lobe. The RV/LV ratio of 1.07 is concerning for right heart strain and submassive pulmonary embolus. The heart is grossly unremarkable in appearance. The thoracic aorta appears intact. The great vessels are unremarkable in appearance. Mediastinum/Nodes: The mediastinum is grossly unremarkable. No mediastinal lymphadenopathy is seen. No pericardial effusion is identified. The thyroid gland is unremarkable in appearance. No axillary lymphadenopathy is appreciated. Lungs/Pleura: A vague mosaic  pattern of parenchymal attenuation is noted bilaterally, which may reflect a variety of processes, including atelectasis, mild interstitial edema or infection. No pleural effusion or pneumothorax is seen. No masses are identified. Upper Abdomen: The visualized portions of the liver and spleen are unremarkable. The patient is status post cholecystectomy, with clips noted at the gallbladder fossa. The visualized portions of the pancreas, adrenal glands and kidneys are within normal limits. Musculoskeletal: No acute osseous abnormalities are identified. The visualized musculature is unremarkable in appearance. Review of the MIP images confirms the above findings. IMPRESSION: 1. Pulmonary embolus within pulmonary artery branches to the right upper lobe, right lower lobe and left lower lobe. CT evidence of right heart strain (RV/LV Ratio = 1.07) consistent with at least submassive (intermediate risk) PE. The presence of right heart strain has been associated with an increased risk of morbidity and mortality. Please activate Code PE by paging [phone]. 2. Vague mosaic pattern of parenchymal attenuation noted bilaterally, which may reflect a variety of processes, including atelectasis, mild interstitial edema or infection. Critical Value/emergent results were called by telephone at the time of interpretation on 04/12/2016 at 1:14 am to Dr. Tomasita CrumbleADELEKE ONI, who verbally acknowledged these results. Electronically Signed   By: Roanna RaiderJeffery  Chang M.D.   On: 04/12/2016 01:15   Dg Chest Portable 1 View  Result Date: 04/12/2016 CLINICAL DATA:  Initial value a acute chest pain, shortness of breath. EXAM: PORTABLE CHEST 1 VIEW COMPARISON:  None available. FINDINGS: Cardiac mediastinal silhouettes are within normal limits. Lungs are normally inflated. No focal infiltrates. No pulmonary edema or pleural effusion. No pneumothorax. No acute osseous abnormality. IMPRESSION: No active disease. Electronically Signed   By: Rise MuBenjamin   McClintock M.D.   On: 04/12/2016 00:15    ASSESSMENT & PLAN:  40 year old male, history of uncontrolled type 2 diabetes, multiple history of diabetic foot infection, status post right foot amputation, still has an open wound at bottom, presented with bilateral PE after recent episode of PICC line-related RUE DVT  1. RUE DVT and bilateral PE -Probably provoked by PICC line, but his bilateral PE developed when he was on Xarelto, not sure about his compliance. Per patient, he was taking Xarelto without missing doses   -He had a very similar episode of DVT and PE last year after PICC line placement. -Given his recurrent DVT and PE, I recommend anticoagulation indefinitely. Certainly if he is not willing to take long-term", he will be reasonable  to obtain a hypercoagulation workup after 6 months of treatment and when he comes off it. -His renal function is normal, I recommend him take Lovenox 1 mg/kg every 12 hours indefinitely. However he is going to lose his insurance, and will not be able to afford Lovenox.  -I suggest him transition to Coumadin, PT/INR monitoring, and diet restriction when on coumadin were reviewed with patient, he agrees. He will follow up with his primary care physician for PT/INR  -Pt was not engaged in the conversation when I discussed the above with patient, I'm not sure if he will compliant  -He lives in [place], I strongly suggest him to follow up with PCP  2. Uncontrolled diabetes 3. Diabetic foot, with open wound  Recommendations:  -continue lovenox, and bridge over to coumadin due to his medication cost issue -I recommend anticoagulation indefinitely. Or consider hypercoagulation work up after 6 month of a/c and when he comes off it to determining his long term anticoagulation plan  -I will see him as needed in future    All questions were answered. The patient knows to call the clinic with any problems, questions or concerns.      Malachy Mood, MD 04/12/2016 1:53  PM

## 2016-04-12 NOTE — Progress Notes (Signed)
Patient is refusing to have 4pm blood sugar checked and any more lab sticks. Education given regarding need for laboratory samples but patient continues to refuse.

## 2016-04-12 NOTE — Care Management Note (Addendum)
Case Management Note  Patient Details  Name: Harry Smith MRN: [id number] Date of Birth: 16-Mar-1976  Subjective/Objective:  Patient presents with [name]  On lovenox, for echo today, CTA showed heart strain.  Plan to check echo.  He has PCP, he has medication coverage and he has transportation at dc. He lives with his mom and dad, his dad has parkinsons and he helps them out at home.  Healed Right foot amputation 4/17.   Has a red spot on the bottom.  NCM will cont to follow for dc needs.                  Action/Plan:   Expected Discharge Date:                  Expected Discharge Plan:  Home/Self Care  In-House Referral:     Discharge planning Services  CM Consult  Post Acute Care Choice:    Choice offered to:     DME Arranged:    DME Agency:     HH Arranged:    HH Agency:     Status of Service:  In process, will continue to follow  If discussed at Long Length of Stay Meetings, dates discussed:    Additional Comments:  Leone Havenaylor, Brycin Kille Clinton, RN 04/12/2016, 9:49 AM

## 2016-04-12 NOTE — ED Notes (Signed)
Attempted report x1. 

## 2016-04-12 NOTE — Progress Notes (Signed)
Inpatient Diabetes Program Recommendations  AACE/ADA: New Consensus Statement on Inpatient Glycemic Control (2015)  Target Ranges:  Prepandial:   less than 140 mg/dL      Peak postprandial:   less than 180 mg/dL (1-2 hours)      Critically ill patients:  140 - 180 mg/dL   Lab Results  Component Value Date   GLUCAP 310 (H) 04/12/2016    Review of Glycemic Control Results for Harry Smith, Harry Smith (MRN [id number]) as of 04/12/2016 09:54  Ref. Range 04/12/2016 06:21  Glucose-Capillary Latest Ref Range: 65 - 99 mg/dL 409310 (H)   Diabetes history: DM2 Outpatient Diabetes medications: Lantus 60 units bid + Novolog 15 units tid meal coverage Current orders for Inpatient glycemic control: Lantus 60 units bid + Novolog 12 units tid meal coverage + Novolog 0-9 units tid with meals  Inpatient Diabetes Program Recommendations:   Please consider A1c to determine prehospital glycemic control and add Novolog correction @ hs 0-5 units.  Thank you, Billy FischerJudy E. Sabria Florido, RN, MSN, CDE Inpatient Glycemic Control Team Team Pager [phone] (8am-5pm) 04/12/2016 10:07 AM

## 2016-04-13 ENCOUNTER — Inpatient Hospital Stay (HOSPITAL_COMMUNITY): Payer: Medicaid - Out of State

## 2016-04-13 DIAGNOSIS — Z9119 Patient's noncompliance with other medical treatment and regimen: Secondary | ICD-10-CM

## 2016-04-13 DIAGNOSIS — E118 Type 2 diabetes mellitus with unspecified complications: Secondary | ICD-10-CM

## 2016-04-13 DIAGNOSIS — Z765 Malingerer [conscious simulation]: Secondary | ICD-10-CM

## 2016-04-13 DIAGNOSIS — N179 Acute kidney failure, unspecified: Secondary | ICD-10-CM

## 2016-04-13 DIAGNOSIS — M86171 Other acute osteomyelitis, right ankle and foot: Secondary | ICD-10-CM

## 2016-04-13 DIAGNOSIS — I2699 Other pulmonary embolism without acute cor pulmonale: Secondary | ICD-10-CM

## 2016-04-13 DIAGNOSIS — I10 Essential (primary) hypertension: Secondary | ICD-10-CM

## 2016-04-13 DIAGNOSIS — G894 Chronic pain syndrome: Secondary | ICD-10-CM

## 2016-04-13 DIAGNOSIS — F1920 Other psychoactive substance dependence, uncomplicated: Secondary | ICD-10-CM

## 2016-04-13 LAB — BASIC METABOLIC PANEL
ANION GAP: 9 (ref 5–15)
BUN: 17 mg/dL (ref 6–20)
CALCIUM: 9.2 mg/dL (ref 8.9–10.3)
CO2: 25 mmol/L (ref 22–32)
Chloride: 99 mmol/L — ABNORMAL LOW (ref 101–111)
Creatinine, Ser: 1.05 mg/dL (ref 0.61–1.24)
Glucose, Bld: 265 mg/dL — ABNORMAL HIGH (ref 65–99)
Potassium: 4.1 mmol/L (ref 3.5–5.1)
Sodium: 133 mmol/L — ABNORMAL LOW (ref 135–145)

## 2016-04-13 LAB — LIPID PANEL
Cholesterol: 193 mg/dL (ref 0–200)
HDL: 25 mg/dL — ABNORMAL LOW (ref >40)
LDL CALC: 93 mg/dL (ref 0–99)
Total CHOL/HDL Ratio: 7.7 RATIO
Triglycerides: 374 mg/dL — ABNORMAL HIGH (ref <150)
VLDL: 75 mg/dL — AB (ref 0–40)

## 2016-04-13 LAB — PROTIME-INR
INR: 0.99
PROTHROMBIN TIME: 13.1 s (ref 11.4–15.2)

## 2016-04-13 LAB — MAGNESIUM: Magnesium: 1.6 mg/dL — ABNORMAL LOW (ref 1.7–2.4)

## 2016-04-13 LAB — ECHOCARDIOGRAM COMPLETE
HEIGHTINCHES: 74 in
WEIGHTICAEL: 4000 [oz_av]

## 2016-04-13 LAB — GLUCOSE, CAPILLARY
Glucose-Capillary: 265 mg/dL — ABNORMAL HIGH (ref 65–99)
Glucose-Capillary: 249 mg/dL — ABNORMAL HIGH (ref 65–99)
Glucose-Capillary: 200 mg/dL — ABNORMAL HIGH (ref 65–99)

## 2016-04-13 MED ORDER — OXYCODONE HCL 5 MG PO TABS
15.0000 mg | ORAL_TABLET | ORAL | Status: DC | PRN
  Administered 2016-04-13: 15 mg via ORAL
  Filled 2016-04-13: qty 3

## 2016-04-13 MED ORDER — HYDROMORPHONE HCL 1 MG/ML IJ SOLN
0.5000 mg | Freq: Once | INTRAMUSCULAR | Status: DC
  Filled 2016-04-13: qty 1

## 2016-04-13 MED ORDER — DIPHENHYDRAMINE HCL 25 MG PO CAPS
25.0000 mg | ORAL_CAPSULE | Freq: Four times a day (QID) | ORAL | Status: DC | PRN
  Administered 2016-04-13: 25 mg via ORAL
  Filled 2016-04-13: qty 1

## 2016-04-13 MED ORDER — METOPROLOL TARTRATE 12.5 MG HALF TABLET
12.5000 mg | ORAL_TABLET | Freq: Two times a day (BID) | ORAL | Status: DC
  Filled 2016-04-13: qty 1

## 2016-04-13 MED ORDER — WARFARIN VIDEO: Freq: Once | Status: DC

## 2016-04-13 MED ORDER — OXYCODONE HCL ER 10 MG PO T12A
10.0000 mg | EXTENDED_RELEASE_TABLET | Freq: Two times a day (BID) | ORAL | Status: DC
  Administered 2016-04-13: 10 mg via ORAL
  Filled 2016-04-13: qty 1

## 2016-04-13 MED ORDER — WARFARIN - PHARMACIST DOSING INPATIENT: Freq: Every day | Status: DC

## 2016-04-13 MED ORDER — COUMADIN BOOK
Freq: Once | Status: AC
  Administered 2016-04-13: 12:00:00
  Filled 2016-04-13: qty 1

## 2016-04-13 MED ORDER — WARFARIN SODIUM 5 MG PO TABS: 10.0000 mg | ORAL_TABLET | Freq: Once | ORAL | Status: DC

## 2016-04-13 MED ORDER — LEVALBUTEROL HCL 1.25 MG/0.5ML IN NEBU: 1.2500 mg | INHALATION_SOLUTION | Freq: Four times a day (QID) | RESPIRATORY_TRACT | Status: DC | PRN

## 2016-04-13 NOTE — Progress Notes (Signed)
Pt continues to complain of increased pain and anxiety. He demands notification of MD in request for additional pain medications. States his psin is located in his right foot, chest, and back. He is continues to ambulate in room independently with refusal of and telemetry or vital sign monitoring until his pain is addressed. MD notified on multiple occassions and new orders received. Will continue to monitor.

## 2016-04-13 NOTE — Progress Notes (Signed)
Pt came out to nursing station again demanding more narcotics and antianxiety medications, stating he will not allow any other procedures or test until he gets more medications. Stating if he doesn't get any additional medication he would leave AMA and find a hospital that would, one that was not on Epic. He then precedes to rip out his IV access and returns to his room. Shortly after transport arrives to transport patient for MRI of his foot but refused to have scan unless he was to receive IV pain medications but he had already removed his only IV access. Dr. Joseph ArtWoods notified and arrived to unit to discharge patient due to noncompliance.

## 2016-04-13 NOTE — Progress Notes (Signed)
PROGRESS NOTE    Harry Smith  ZOX:[id number] DOB: 1976-01-07 DOA: 04/11/2016 PCP: No PCP Per Patient   Brief Narrative:  Harry Smith is a 40 y.o adopted WM PMHx . 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 Placed on vancomycin+ Zosyn 3 September>>> tentative stop date 19 October in [place]. However diagnosed RUE DVT secondary to PICC line, and was placed on xarelto. Patient left [facility] AMA    Presents to the ER with complaints of pleuritic-type of chest pain and shortness of breath since yesterday afternoon. Denies any associated fever chills had some productive cough which patient states is chronic. CT angiogram revealed a pulmonary embolism with heart strain but the patient appears hemodynamically stable. He has a right foot amputation with ulceration no active discharge on physical exam. ED Physician started him on IV heparin and pain medications. Admitted for further evaluation. Patient also states has had a previous history of PE last year when he had a left upper extremity PICC line placed.   Subjective: 10/11 A/O 4 sitting in bed comfortably chewing snuff. Overnight admits to being noncompliant with nursing request for telemetry monitoring. In addition patient has been noncompliant with requests for lab draws. Noncompliant with medication. Drug-seeking behavior. Admits to checking out of previous [facility]. [facility].   Assessment & Plan:   Principal Problem:   Other pulmonary embolism without acute cor pulmonale (HCC) Active Problems:   Type 2 diabetes mellitus with hyperglycemia (HCC)   Other chronic pain   Acute bilateral pulmonary embolism/ suspected submassive.  PESI Score= 49 -Will continue full dose anticoagulation with enoxapain, patient pulled IV access. Follow on echocardiogram. Blood pressure stable. RV  strain per CT report. Patient has been on rivaroxaban, per his report taking medication as instructed. Consulted hematology in regards of further anticoagulation. Patient complains of significant pleuritic chest pain, requesting narcotics. -Xopenex QID  Previous MI/Bundle-Branch Block -Patient's EKG shows possible previous MI -Troponin 2 negative -Echocardiogram: Shows bundle-branch block see results below. -Patient was informed that he was at increased risk of death from MI or stroke if we did not control his hypertension, patient refused hypertension medication. Demanded IV Narcotics and IV Benzodiazepine See HTN  HTN -Metoprolol 12.5 mg BID patient refuses medication  Diabetes type 2 uncontrolled with complication  -Will continue diabetic diet, insulin sliding scale for glucose cover and monitoring. Basal insulin with 60 units bid glargine and pre-meal 12 units.  -Hemoglobin A1c pending -Lipid panel pending  Chronic pain syndrome.  -Multifactorial Secondary to back injury RUE DVT and PE -Per [facility] hospitalist SOB patient able to take PO pain medication no indication for IV drug administration, therefore DO NOT ADMINISTER SCHEDULED IV CONTROLLED MEDICATION. -Patient constantly threatening to leave AMA unless he receives IV narcotics and benzodiazepines. States to nursing staff that he understands he is on the [employer], but will follow [facility] that does not have access to Epic which will supply him with medication he requests.  Drug-seeking behavior/Drug abuse and Dependence -Overnight patient refused all lab draws and medication, and threatened to leave AMA and less an IV was placed and he received Dilaudid and Xanax -Per patient has no physician monitoring pain medication. [facility] -OxyContin 10 mg BID -Decrease oxycodone IR to 15 mg q 4 hr PRN -Benadryl 25 mg every 6 hours PRN itching  AKI.  -Renal function has improved, cr  down to 1.0 from 1.39, will avoid nephrotoxic agents or hypotension, follow renal panel in am.   Noncompliant with therapeutic/medical treatment -Patient has been refusing therapeutic and medical treatment unless he receives IV pain medication and IV benzodiazepine. -Suspect this may be why patient checked out AMA from [place] [facility]. -  Osteomyelitis right foot? -Patient ripped out IV lines and telemetry wires, refused to go downstairs for MRI of foot. -Patient walked out to maintain nursing desk stated before he would allow someone to replace IV line for the third time in 24 hours wanted to know what IV narcotics and or benzodiazepines he would get prior to allowing placement of line. -RN Whitney contacted myself at this point for discharge       DVT prophylaxis: Lovenox--> Coumadin Code Status: Full Family Communication: None Disposition Plan: Home   Consultants:  Hematology    Procedures/Significant Events:  10/10 CT angiogram PE protocol:Pulmonary embolus within pulmonary artery branches to the right upper lobe, right lower lobe and left lower lobe. CT evidence of right heart strain (RV/LV Ratio = 1.07) consistent with at least submassive (intermediate risk) PE. The presence of right heart strain has been associated with an increased risk of morbidity and mortality.  10/11 Echocardiogram:LVEF= 65% to 70%. -(grade 1 diastolic dysfunction). - Ventricular septum: Septal motion showed abnormal function and   dyssynergy (bundle branch block present).    Cultures None  Antimicrobials: None   Devices None   LINES / TUBES:  None    Continuous Infusions:    Objective: Vitals:   04/12/16 1100 04/12/16 1916 04/13/16 0352 04/13/16 0849  BP:  (!) 176/92 (!) 160/86 (!) 172/88  Pulse:  84 75 85  Resp:  17 16   Temp: 98.7 F (37.1 C) 98.1 F (36.7 C) 98.1 F (36.7 C) 97.5 F (36.4 C)  TempSrc: Oral Oral Oral Oral  SpO2:  93% 98% 96%  Weight:      Height:         Intake/Output Summary (Last 24 hours) at 04/13/16 1010 Last data filed at 04/13/16 0400  Gross per 24 hour  Intake              480 ml  Output             1300 ml  Net             -820 ml   Filed Weights   04/11/16 2252  Weight: 113.4 kg (250 lb)    Examination: General: A/O 4, NAD, drug-seeking behavior, No acute respiratory distress Eyes: negative scleral hemorrhage, negative anisocoria, negative icterus ENT: Negative Runny nose, negative gingival bleeding, Neck:  Negative scars, masses, torticollis, lymphadenopathy, JVD Lungs: Clear to auscultation bilaterally without wheezes or crackles Cardiovascular: Regular rate and rhythm without murmur gallop or rub normal S1 and S2 Abdomen: negative abdominal pain, nondistended, positive soft, bowel sounds, no rebound, no ascites, no appreciable mass Extremities: positive erythema, swelling right foot with lesion plantar surface see picture below. Swelling of R UE right bicep 13.5 inches in circumference: Left bicep 13 inches, Right Bicep 13 inches circumference,: Right forearm 13.5 inches circumference, Left forearm 12.5 inches circumference  Psychiatric:  Negative depression, negative anxiety, negative fatigue, negative mania  Central nervous system:  Cranial nerves II through XII intact, tongue/uvula midline, all extremities muscle strength 5/5, sensation intact throughout,negative dysarthria, negative expressive aphasia, negative receptive aphasia.  .         Data Reviewed: Care during the described time interval was  provided by me .  I have reviewed this patient's available data, including medical history, events of note, physical examination, and all test results as part of my evaluation. I have personally reviewed and interpreted all radiology studies.  CBC:  Recent Labs Lab 04/11/16 2302 04/12/16 0400  WBC 9.2 9.5  HGB 13.0 12.2*  HCT 40.0 37.6*  MCV 88.5 87.9  PLT 315 246   Basic Metabolic Panel:  Recent  Labs Lab 04/11/16 2302 04/12/16 0400  NA 132* 132*  K 4.2 4.0  CL 95* 98*  CO2 25 24  GLUCOSE 397* 326*  BUN 22* 20  CREATININE 1.39* 1.09  CALCIUM 9.7 9.1   GFR: Estimated Creatinine Clearance: 121.9 mL/min (by C-G formula based on SCr of 1.09 mg/dL). Liver Function Tests: No results for input(s): AST, ALT, ALKPHOS, BILITOT, PROT, ALBUMIN in the last 168 hours. No results for input(s): LIPASE, AMYLASE in the last 168 hours. No results for input(s): AMMONIA in the last 168 hours. Coagulation Profile:  Recent Labs Lab 04/11/16 2302  INR 1.07   Cardiac Enzymes:  Recent Labs Lab 04/12/16 0400 04/12/16 1123  TROPONINI <0.03 <0.03   BNP (last 3 results) No results for input(s): PROBNP in the last 8760 hours. HbA1C: No results for input(s): HGBA1C in the last 72 hours. CBG:  Recent Labs Lab 04/12/16 0621 04/12/16 1130 04/12/16 1914 04/13/16 0848  GLUCAP 310* 330* 295* 265*   Lipid Profile: No results for input(s): CHOL, HDL, LDLCALC, TRIG, CHOLHDL, LDLDIRECT in the last 72 hours. Thyroid Function Tests: No results for input(s): TSH, T4TOTAL, FREET4, T3FREE, THYROIDAB in the last 72 hours. Anemia Panel: No results for input(s): VITAMINB12, FOLATE, FERRITIN, TIBC, IRON, RETICCTPCT in the last 72 hours. Urine analysis:    Component Value Date/Time   COLORURINE YELLOW 11/02/2013 0727   APPEARANCEUR CLEAR 11/02/2013 0727   LABSPEC 1.010 11/02/2013 0727   PHURINE 5.5 11/02/2013 0727   GLUCOSEU >1000 (A) 11/02/2013 0727   HGBUR TRACE (A) 11/02/2013 0727   BILIRUBINUR NEGATIVE 11/02/2013 0727   KETONESUR NEGATIVE 11/02/2013 0727   PROTEINUR NEGATIVE 11/02/2013 0727   UROBILINOGEN 0.2 11/02/2013 0727   NITRITE NEGATIVE 11/02/2013 0727   LEUKOCYTESUR NEGATIVE 11/02/2013 0727   Sepsis Labs: @LABRCNTIP (procalcitonin:4,lacticidven:4)  ) Recent Results (from the past 240 hour(s))  MRSA PCR Screening     Status: None   Collection Time: 04/12/16  6:52 AM  Result  Value Ref Range Status   MRSA by PCR NEGATIVE NEGATIVE Final    Comment:        The GeneXpert MRSA Assay (FDA approved for NASAL specimens only), is one component of a comprehensive MRSA colonization surveillance program. It is not intended to diagnose MRSA infection nor to guide or monitor treatment for MRSA infections.          Radiology Studies: Ct Angio Chest Pe W Or Wo Contrast  Result Date: 04/12/2016 CLINICAL DATA:  Acute onset of mid chest pain, radiating to the back. Initial encounter. EXAM: CT ANGIOGRAPHY CHEST WITH CONTRAST TECHNIQUE: Multidetector CT imaging of the chest was performed using the standard protocol during bolus administration of intravenous contrast. Multiplanar CT image reconstructions and MIPs were obtained to evaluate the vascular anatomy. CONTRAST:  100 mL of Isovue 370 IV contrast COMPARISON:  Chest radiograph performed 04/11/2016 FINDINGS: Cardiovascular: Pulmonary embolus is noted within pulmonary arteries to the right upper lobe, right lower lobe and left lower lobe. The RV/LV ratio of 1.07 is concerning for right heart strain and submassive pulmonary embolus.  The heart is grossly unremarkable in appearance. The thoracic aorta appears intact. The great vessels are unremarkable in appearance. Mediastinum/Nodes: The mediastinum is grossly unremarkable. No mediastinal lymphadenopathy is seen. No pericardial effusion is identified. The thyroid gland is unremarkable in appearance. No axillary lymphadenopathy is appreciated. Lungs/Pleura: A vague mosaic pattern of parenchymal attenuation is noted bilaterally, which may reflect a variety of processes, including atelectasis, mild interstitial edema or infection. No pleural effusion or pneumothorax is seen. No masses are identified. Upper Abdomen: The visualized portions of the liver and spleen are unremarkable. The patient is status post cholecystectomy, with clips noted at the gallbladder fossa. The visualized  portions of the pancreas, adrenal glands and kidneys are within normal limits. Musculoskeletal: No acute osseous abnormalities are identified. The visualized musculature is unremarkable in appearance. Review of the MIP images confirms the above findings. IMPRESSION: 1. Pulmonary embolus within pulmonary artery branches to the right upper lobe, right lower lobe and left lower lobe. CT evidence of right heart strain (RV/LV Ratio = 1.07) consistent with at least submassive (intermediate risk) PE. The presence of right heart strain has been associated with an increased risk of morbidity and mortality. Please activate Code PE by paging [phone]. 2. Vague mosaic pattern of parenchymal attenuation noted bilaterally, which may reflect a variety of processes, including atelectasis, mild interstitial edema or infection. Critical Value/emergent results were called by telephone at the time of interpretation on 04/12/2016 at 1:14 am to Dr. Tomasita Crumble, who verbally acknowledged these results. Electronically Signed   By: Roanna Raider M.D.   On: 04/12/2016 01:15   Dg Chest Portable 1 View  Result Date: 04/12/2016 CLINICAL DATA:  Initial value a acute chest pain, shortness of breath. EXAM: PORTABLE CHEST 1 VIEW COMPARISON:  None available. FINDINGS: Cardiac mediastinal silhouettes are within normal limits. Lungs are normally inflated. No focal infiltrates. No pulmonary edema or pleural effusion. No pneumothorax. No acute osseous abnormality. IMPRESSION: No active disease. Electronically Signed   By: Rise Mu M.D.   On: 04/12/2016 00:15        Scheduled Meds: . enoxaparin (LOVENOX) injection  1 mg/kg Subcutaneous Q12H  . gabapentin  400 mg Oral BID  . gabapentin  800 mg Oral QHS  . insulin aspart  0-9 Units Subcutaneous TID WC  . insulin aspart  12 Units Subcutaneous TID AC  . insulin glargine  60 Units Subcutaneous BID   Continuous Infusions:    LOS: 1 day    Time spent: 90  minutes    Herlinda Heady, Roselind Messier, MD [facility] Pager [phone]   If 7PM-7AM, please contact night-coverage [email or web address] Password [email or web address] 04/13/2016, 10:10 AM

## 2016-04-13 NOTE — Progress Notes (Signed)
Pt's tip of his R foot is red and swollen.  Pt states he should be getting vanc, zosyn.  Pt also complaining of constant pain with little relief with pain meds, although he sleeps intermittently.  NP Schorr on call notified, communication with team and one time order given.  Pt still continues to be noncompliant, refusing telemetry, labs, and vital signs as in previous notes.  Will continue to monitor.

## 2016-04-13 NOTE — Progress Notes (Signed)
Pt continues to remain hypertensive, last reading is 172/88 and refuses to take scheduled lopressor that was started today. Education given regarding risk factors for elevated bp and patient continues to refuse medication.  Notified Dr. Joseph ArtWoods.

## 2016-04-13 NOTE — Discharge Summary (Signed)
Physician Discharge Summary  Harry Smith ZOX:[id number] DOB: 1975-11-12 DOA: 04/11/2016  PCP: No PCP Per Patient  Admit date: 04/11/2016 Discharge date: 04/14/2016  Time spent: 10 minutes  Recommendations for Outpatient Follow-up:    Acute bilateral pulmonary embolism/ suspected submassive.  PESI Score= 49 -Will continue full dose anticoagulation with enoxapain, patient pulled IV access. Follow on echocardiogram. Blood pressure stable. RV strain per CT report. Patient has been on rivaroxaban, per his report taking medication as instructed. Consulted hematology in regards of further anticoagulation. Patient complains of significant pleuritic chest pain, requesting narcotics. -Xopenex QID -Unfortunately patient not discharged on Coumadin as recommended by hematology  for PE as he has refused all medical treatment except for requests for narcotics and benzodiazepines. In addition would not discharge patient without a follow-up plan for monitoring a medication which would increase his risk for GI bleed and possibly death. Patient was warned numerous times during the day concerning the need for appropriate treatment for his medical condition however his only concern was in obtaining IV narcotics and benzodiazepines. -Patient to resume previous home medication Xarelto 15 mg BID  Previous MI/Bundle-Branch Block -Patient's EKG shows possible previous MI -Troponin 2 negative -Echocardiogram: Shows bundle-branch block see results below. -Patient was informed that he was at increased risk of death from MI or stroke if we did not control his hypertension, patient refused hypertension medication. Demanded IV Narcotics and IV Benzodiazepine See HTN  HTN -Metoprolol 12.5 mg BID. PATIENT REFUSED MEDICATION  Diabetes type 2 uncontrolled with complication  -Will continue diabetic diet, insulin sliding scale for glucose cover and monitoring. Basal insulin with 60 units bid glargine and pre-meal 12  units.  -Hemoglobin A1c pending -Lipid panel pending  Chronic pain syndrome.  -Multifactorial Secondary to back injury RUE DVT and PE -Per [facility] hospitalist SOB patient able to take PO pain medication no indication for IV drug administration, therefore DO NOT ADMINISTER SCHEDULED IV CONTROLLED MEDICATION. -Patient constantly threatening to leave AMA unless he receives IV narcotics and benzodiazepines. States to nursing staff that he understands he is on the [employer], but will follow hospital that does not have access to Epic which will supply him with medication he requests.  Drug-seeking behavior -Overnight patient refused all lab draws and medication, and threatened to leave AMA and less an IV was placed and he received Dilaudid and Xanax -Per patient has no physician monitoring pain medication. Was initially prescribed by [facility]. -OxyContin 10 mg BID -Decrease Oxycodone IR to 15 mg q 4 hr PRN -Benadryl 25 mg every 6 hours PRN itching  AKI. Renal function has improved, cr down to 1.0 from 1.39, will avoid nephrotoxic agents or hypotension, follow renal panel in am.   Noncompliant with therapeutic/medical treatment -Patient has been refusing therapeutic and medical treatment unless he receives IV pain medication and IV benzodiazepine. -Suspect this may be why patient checked out AMA from [place] hospital.  Osteomyelitis right foot? -Patient ripped out IV lines and telemetry wires, refused to go downstairs for MRI of foot. -Patient walked out to maintain nursing desk stated before he would allow someone to replace IV line for the third time in 24 hours wanted to know what IV narcotics and or benzodiazepines he would get prior to allowing placement of line. -RN Whitney contacted myself at this point for discharge    Discharge Diagnoses:  Principal Problem:   Other pulmonary embolism without acute cor pulmonale (HCC) Active Problems:   Type 2 diabetes  mellitus with hyperglycemia (  HCC)   Other chronic pain   Uncontrolled type 2 diabetes mellitus with complication (HCC)   Chronic pain syndrome   Essential hypertension   Drug-seeking behavior   Drug abuse and dependence (HCC)   Acute kidney injury (HCC)   Medically noncompliant   Pyogenic inflammation of bone (HCC)   Discharge Condition: Stable  Diet recommendation: Regular  Filed Weights   04/11/16 2252  Weight: 113.4 kg (250 lb)    History of present illness:  Harry Smith a 40 y.o adopted [place] PMHx .DMtype II uncontrolled with complications, 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 Placed on vancomycin+ Zosyn 3 September>>> tentative stop date 19 October in [place]. However diagnosed RUE DVT secondary to PICC line, and was placed on xarelto. Patient left hospital AMA    Presents to the ER with complaints of pleuritic-type of chest pain and shortness of breath since yesterday afternoon.Denies any associated fever chills had some productive cough which patient states is chronic.CT angiogram revealed a pulmonary embolism with heart strain but the patient appears hemodynamically stable. He has a right foot amputation with ulcerationno active discharge on physical exam. ED Physicianwanted to start him on IV heparin and pain medications. Admitted for further evaluation.Patient also states has had a previous history of PE last year when he had a left upper extremity PICC line placed.  10/11 A/O 4 sitting in bed comfortably chewing snuff. Overnight admits to being noncompliant with nursing request for telemetry monitoring. In addition patient has been noncompliant with requests for lab draws. Noncompliant with medication. Drug-seeking behavior. Admits to checking out of previous hospital AMA. States [facility] prescribed him pain medication. During his hospitalization patient continued to refuse therapeutic  and diagnostic modalities. Overnight patient ripped off all of his telemetry, refused lab draws, refused medications. Insisted that he be given IV Dilaudid and Xanax or he would leave AMA. This A.m. patient was found in bed with all monitoring equipment off chewing snuff. Initially refusing to allow nursing staff to place telemetry, had refused lab draws, had refused medication. Had informed patient that he had the right to refuse all treatment, diagnostic modalities and if that was his wish we would discharge him. Initially patient agreed to comply with diagnostic and therapeutic modalities however throughout the day continue to demand narcotic medication and benzodiazepines. Informed nursing staff (RN Stratford) that if we did not increase his narcotics and benzodiazepine he would leave and find a hospital that did not have EPIC and would increase his medications. He also informed her that he knew he was on the national watch list. I received a call that patient again removed all monitoring equipment, pulled out his IV, and when the MRI technicians arrived was standing at the desk demanding narcotics and benzodiazepine (wanted to know what medication she was going to be given prior to allowing someone to come again to place an IV for the third time) and would not go with the MRI technicians. I informed RN Alphonzo Lemmings that I would be up to discharge patient because he had already been informed earlier today that he had the right to refuse all diagnostic/therapeutic modalities which this constituted, however he would be discharged.  Consultants:  Hematology    Procedures/Significant Events:  10/10 CT angiogram PE protocol:Pulmonary embolus within pulmonary artery branches to the right upper lobe, right lower lobe and left lower lobe. CT evidence of right heart strain (RV/LV Ratio = 1.07) consistent with at least submassive (intermediate risk)  PE. The presence of right heart strain has been associated with an  increased risk of morbidity and mortality.  10/11 Echocardiogram:LVEF= 65% to 70%. -(grade 1 diastolic dysfunction). - Ventricular septum: Septal motion showed abnormal function and dyssynergy (bundle branch block present).   Discharge Exam: Vitals:   04/12/16 1100 04/12/16 1916 04/13/16 0352 04/13/16 0849  BP:  (!) 176/92 (!) 160/86 (!) 172/88  Pulse:  84 75 85  Resp:  17 16   Temp: 98.7 F (37.1 C) 98.1 F (36.7 C) 98.1 F (36.7 C) 97.5 F (36.4 C)  TempSrc: Oral Oral Oral Oral  SpO2:  93% 98% 96%  Weight:      Height:        General: A/O 4, NAD, drug-seeking behavior, No acute respiratory distress Eyes: negative scleral hemorrhage, negative anisocoria, negative icterus ENT: Negative Runny nose, negative gingival bleeding, Neck:  Negative scars, masses, torticollis, lymphadenopathy, JVD Lungs: Clear to auscultation bilaterally without wheezes or crackles Cardiovascular: Regular rate and rhythm without murmur gallop or rub normal S1 and S2 Abdomen: negative abdominal pain, nondistended, positive soft, bowel sounds, no rebound, no ascites, no appreciable mass Extremities: positive erythema, swelling right foot with lesion plantar surface see picture below. Swelling of R UE right bicep 13.5 inches in circumference: Left bicep 13 inches, Right Bicep 13 inches circumference,: Right forearm 13.5 inches circumference, Left forearm 12.5 inches circumference  Psychiatric:  Negative depression, negative anxiety, negative fatigue, negative mania  Central nervous system:  Cranial nerves II through XII intact, tongue/uvula midline, all extremities muscle strength 5/5, sensation intact throughout,negative dysarthria, negative expressive aphasia, negative receptive aphasia. Discharge Instructions     Medication List    TAKE these medications   gabapentin 400 MG capsule Commonly known as:  NEURONTIN Take 400-800 mg by mouth 3 (three) times daily. And then take 2 capsules at bedtime.    insulin aspart 100 UNIT/ML injection Commonly known as:  novoLOG Inject 15 Units into the skin 3 (three) times daily before meals. And then sliding scale as needed.   insulin glargine 100 UNIT/ML injection Commonly known as:  LANTUS Inject 60 Units into the skin 2 (two) times daily.   oxycodone 30 MG immediate release tablet Commonly known as:  ROXICODONE Take 30 mg by mouth every 4 (four) hours as needed for pain.   Rivaroxaban 15 MG Tabs tablet Commonly known as:  XARELTO Take 15 mg by mouth 2 (two) times daily with a meal.      Allergies  Allergen Reactions  . Tramadol Nausea Only  . Ketorolac Tromethamine Nausea And Vomiting and Rash  . Morphine And Related Rash  . Suboxone  [Buprenorphine Hcl-Naloxone Hcl] Rash    Rash after injection in leg      The results of significant diagnostics from this hospitalization (including imaging, microbiology, ancillary and laboratory) are listed below for reference.    Significant Diagnostic Studies: Ct Angio Chest Pe W Or Wo Contrast  Result Date: 04/12/2016 CLINICAL DATA:  Acute onset of mid chest pain, radiating to the back. Initial encounter. EXAM: CT ANGIOGRAPHY CHEST WITH CONTRAST TECHNIQUE: Multidetector CT imaging of the chest was performed using the standard protocol during bolus administration of intravenous contrast. Multiplanar CT image reconstructions and MIPs were obtained to evaluate the vascular anatomy. CONTRAST:  100 mL of Isovue 370 IV contrast COMPARISON:  Chest radiograph performed 04/11/2016 FINDINGS: Cardiovascular: Pulmonary embolus is noted within pulmonary arteries to the right upper lobe, right lower lobe and left lower lobe. The RV/LV ratio  of 1.07 is concerning for right heart strain and submassive pulmonary embolus. The heart is grossly unremarkable in appearance. The thoracic aorta appears intact. The great vessels are unremarkable in appearance. Mediastinum/Nodes: The mediastinum is grossly unremarkable. No  mediastinal lymphadenopathy is seen. No pericardial effusion is identified. The thyroid gland is unremarkable in appearance. No axillary lymphadenopathy is appreciated. Lungs/Pleura: A vague mosaic pattern of parenchymal attenuation is noted bilaterally, which may reflect a variety of processes, including atelectasis, mild interstitial edema or infection. No pleural effusion or pneumothorax is seen. No masses are identified. Upper Abdomen: The visualized portions of the liver and spleen are unremarkable. The patient is status post cholecystectomy, with clips noted at the gallbladder fossa. The visualized portions of the pancreas, adrenal glands and kidneys are within normal limits. Musculoskeletal: No acute osseous abnormalities are identified. The visualized musculature is unremarkable in appearance. Review of the MIP images confirms the above findings. IMPRESSION: 1. Pulmonary embolus within pulmonary artery branches to the right upper lobe, right lower lobe and left lower lobe. CT evidence of right heart strain (RV/LV Ratio = 1.07) consistent with at least submassive (intermediate risk) PE. The presence of right heart strain has been associated with an increased risk of morbidity and mortality. Please activate Code PE by paging [phone]. 2. Vague mosaic pattern of parenchymal attenuation noted bilaterally, which may reflect a variety of processes, including atelectasis, mild interstitial edema or infection. Critical Value/emergent results were called by telephone at the time of interpretation on 04/12/2016 at 1:14 am to Dr. Tomasita Crumble, who verbally acknowledged these results. Electronically Signed   By: Roanna Raider M.D.   On: 04/12/2016 01:15   Dg Chest Portable 1 View  Result Date: 04/12/2016 CLINICAL DATA:  Initial value a acute chest pain, shortness of breath. EXAM: PORTABLE CHEST 1 VIEW COMPARISON:  None available. FINDINGS: Cardiac mediastinal silhouettes are within normal limits. Lungs are  normally inflated. No focal infiltrates. No pulmonary edema or pleural effusion. No pneumothorax. No acute osseous abnormality. IMPRESSION: No active disease. Electronically Signed   By: Rise Mu M.D.   On: 04/12/2016 00:15    Microbiology: Recent Results (from the past 240 hour(s))  MRSA PCR Screening     Status: None   Collection Time: 04/12/16  6:52 AM  Result Value Ref Range Status   MRSA by PCR NEGATIVE NEGATIVE Final    Comment:        The GeneXpert MRSA Assay (FDA approved for NASAL specimens only), is one component of a comprehensive MRSA colonization surveillance program. It is not intended to diagnose MRSA infection nor to guide or monitor treatment for MRSA infections.      Labs: Basic Metabolic Panel:  Recent Labs Lab 04/11/16 2302 04/12/16 0400 04/13/16 1146  NA 132* 132* 133*  K 4.2 4.0 4.1  CL 95* 98* 99*  CO2 25 24 25   GLUCOSE 397* 326* 265*  BUN 22* 20 17  CREATININE 1.39* 1.09 1.05  CALCIUM 9.7 9.1 9.2  MG  --   --  1.6*   Liver Function Tests: No results for input(s): AST, ALT, ALKPHOS, BILITOT, PROT, ALBUMIN in the last 168 hours. No results for input(s): LIPASE, AMYLASE in the last 168 hours. No results for input(s): AMMONIA in the last 168 hours. CBC:  Recent Labs Lab 04/11/16 2302 04/12/16 0400  WBC 9.2 9.5  HGB 13.0 12.2*  HCT 40.0 37.6*  MCV 88.5 87.9  PLT 315 246   Cardiac Enzymes:  Recent Labs Lab 04/12/16 0400  04/12/16 1123  TROPONINI <0.03 <0.03   BNP: BNP (last 3 results)  Recent Labs  04/12/16 0400  BNP 9.1    ProBNP (last 3 results) No results for input(s): PROBNP in the last 8760 hours.  CBG:  Recent Labs Lab 04/12/16 1130 04/12/16 1914 04/13/16 0848 04/13/16 1307 04/13/16 1707  GLUCAP 330* 295* 265* 249* 200*       Signed:  Carolyne Littles, MD [facility] [phone] pager

## 2016-04-13 NOTE — Progress Notes (Addendum)
ANTICOAGULATION CONSULT NOTE  Pharmacy Consult for Lovenox Indication: pulmonary embolus  Allergies  Allergen Reactions  . Tramadol Nausea Only  . Ketorolac Tromethamine Nausea And Vomiting and Rash  . Morphine And Related Rash  . Suboxone  [Buprenorphine Hcl-Naloxone Hcl] Rash    Rash after injection in leg    Patient Measurements: Height: 6\' 2"  (188 cm) Weight: 250 lb (113.4 kg) IBW/kg (Calculated) : 82.2  Vital Signs: Temp: 97.5 F (36.4 C) (10/11 0849) Temp Source: Oral (10/11 0849) BP: 172/88 (10/11 0849) Pulse Rate: 85 (10/11 0849)  Labs:  Recent Labs  04/11/16 2302 04/12/16 0400 04/12/16 1123  HGB 13.0 12.2*  --   HCT 40.0 37.6*  --   PLT 315 246  --   APTT  --  30  --   LABPROT 13.9  --   --   INR 1.07  --   --   HEPARINUNFRC  --  <0.10*  --   CREATININE 1.39* 1.09  --   TROPONINI  --  <0.03 <0.03    Estimated Creatinine Clearance: 121.9 mL/min (by C-G formula based on SCr of 1.09 mg/dL).   Medical History: Past Medical History:  Diagnosis Date  . Diabetes mellitus without complication (HCC)   . DVT (deep venous thrombosis) (HCC) 04/03/2016  . Rotator cuff dysfunction, left     Medications:  Scheduled:  . coumadin book   Does not apply Once  . enoxaparin (LOVENOX) injection  1 mg/kg Subcutaneous Q12H  . gabapentin  400 mg Oral BID  . gabapentin  800 mg Oral QHS  . insulin aspart  0-9 Units Subcutaneous TID WC  . insulin aspart  12 Units Subcutaneous TID AC  . insulin glargine  60 Units Subcutaneous BID  . oxyCODONE  10 mg Oral Q12H  . warfarin  10 mg Oral ONCE-1800  . warfarin   Does not apply Once  . Warfarin - Pharmacist Dosing Inpatient   Does not apply q1800   Infusions:    Assessment: 40 yo male on Lovenox for an acute PE with R heart strain. He was recently diagnosed with a R upper extremity DVT due to having a PICC, which he was started on Xarelto as an outpatient. Despite being on Xarelto, he developed a new PE. Due to his  history and cost of NOACs he will be started on warfarin today. Baseline INR 1.  Goal of Therapy:  Anti-Xa level 0.6-1 units/ml 4hrs after LMWH dose given Monitor platelets by anticoagulation protocol: Yes  INR 2-3   Plan:  Lovenox 115 mg [place] q12h Warfarin 10 mg po x1 Daily INR, CBC q72h Stop Lovenox when INR > 2 x 24 hours after a minimum of 5 days   Addendum -Had an extensive discussion with the patient about his warfarin and pain medications -Pt expressed his extreme displeasure with our management of his pain control, I tried to rationale of using Oxycontin + Oxycodone; however he still asked to be put on his pta pain regimen   Agapito GamesAlison Ravinder Lukehart, PharmD, BCPS Clinical Pharmacist 04/13/2016 11:34 AM

## 2016-04-14 ENCOUNTER — Encounter (HOSPITAL_COMMUNITY): Payer: Self-pay | Admitting: Internal Medicine

## 2016-04-14 DIAGNOSIS — Z765 Malingerer [conscious simulation]: Secondary | ICD-10-CM

## 2016-04-14 DIAGNOSIS — IMO0002 Reserved for concepts with insufficient information to code with codable children: Secondary | ICD-10-CM

## 2016-04-14 DIAGNOSIS — E1165 Type 2 diabetes mellitus with hyperglycemia: Secondary | ICD-10-CM

## 2016-04-14 DIAGNOSIS — E118 Type 2 diabetes mellitus with unspecified complications: Secondary | ICD-10-CM

## 2016-04-14 DIAGNOSIS — F1920 Other psychoactive substance dependence, uncomplicated: Secondary | ICD-10-CM

## 2016-04-14 DIAGNOSIS — Z9119 Patient's noncompliance with other medical treatment and regimen: Secondary | ICD-10-CM

## 2016-04-14 DIAGNOSIS — N179 Acute kidney failure, unspecified: Secondary | ICD-10-CM

## 2016-04-14 DIAGNOSIS — M869 Osteomyelitis, unspecified: Secondary | ICD-10-CM

## 2016-04-14 DIAGNOSIS — I10 Essential (primary) hypertension: Secondary | ICD-10-CM

## 2016-04-14 DIAGNOSIS — G894 Chronic pain syndrome: Secondary | ICD-10-CM

## 2016-04-14 DIAGNOSIS — Z91199 Patient's noncompliance with other medical treatment and regimen due to unspecified reason: Secondary | ICD-10-CM

## 2016-04-14 LAB — HEMOGLOBIN A1C
HEMOGLOBIN A1C: 12 % — AB (ref 4.8–5.6)
MEAN PLASMA GLUCOSE: 298 mg/dL

## 2016-04-18 LAB — CULTURE, BLOOD (ROUTINE X 2)
CULTURE: NO GROWTH
CULTURE: NO GROWTH

## 2016-09-02 ENCOUNTER — Emergency Department (HOSPITAL_BASED_OUTPATIENT_CLINIC_OR_DEPARTMENT_OTHER)
Admission: EM | Admit: 2016-09-02 | Discharge: 2016-09-02 | Disposition: A | Discharge status: Home / Self Care | Payer: Medicaid - Out of State | Attending: Emergency Medicine | Admitting: Emergency Medicine

## 2016-09-02 ENCOUNTER — Encounter (HOSPITAL_BASED_OUTPATIENT_CLINIC_OR_DEPARTMENT_OTHER): Payer: Self-pay | Admitting: Emergency Medicine

## 2016-09-02 DIAGNOSIS — I10 Essential (primary) hypertension: Secondary | ICD-10-CM | POA: Insufficient documentation

## 2016-09-02 DIAGNOSIS — Z765 Malingerer [conscious simulation]: Secondary | ICD-10-CM | POA: Diagnosis not present

## 2016-09-02 DIAGNOSIS — F17220 Nicotine dependence, chewing tobacco, uncomplicated: Secondary | ICD-10-CM | POA: Insufficient documentation

## 2016-09-02 DIAGNOSIS — Z794 Long term (current) use of insulin: Secondary | ICD-10-CM | POA: Diagnosis not present

## 2016-09-02 DIAGNOSIS — E119 Type 2 diabetes mellitus without complications: Secondary | ICD-10-CM | POA: Insufficient documentation

## 2016-09-02 DIAGNOSIS — Z4801 Encounter for change or removal of surgical wound dressing: Secondary | ICD-10-CM | POA: Diagnosis present

## 2016-09-02 HISTORY — DX: Other pulmonary embolism without acute cor pulmonale: I26.99

## 2016-09-02 LAB — CBG MONITORING, ED
Glucose-Capillary: 311 mg/dL — ABNORMAL HIGH (ref 65–99)
Glucose-Capillary: 421 mg/dL — ABNORMAL HIGH (ref 65–99)

## 2016-09-02 NOTE — ED Triage Notes (Signed)
Amputation of distal right foot in December in [place].  Has not healed completely.  Throbbing and redness. Went to [facility] in [place] this evening and got IV Clindamycin. Has been on Doxycycline for "some time".  Blood sugars have been running high.  Pt concerned and wanted to be admitted instead of discharged on oral abx so he came here for another opinion.

## 2016-09-02 NOTE — ED Provider Notes (Signed)
[facility] Provider Note: Harry Dell, MD, FACEP  CSN: [id number] MRN: [id number] ARRIVAL: 09/02/16 at 0132 ROOM: MHOTF/OTF   CHIEF COMPLAINT  Wound Check   HISTORY OF PRESENT ILLNESS  Harry Smith is a 41 y.o. 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 This was done in [place]. He has had multiple visits to facilities in states including [place], [place], [place], [place] and [place] seeking treatment for this. He has a well-documented history of drug-seeking behavior and noncompliance with prescribed regimens, leaving or threatening to leave AMA if he does not receive narcotics.  He was seen earlier this morning at [facility] in [place] complaining of pain in his right foot. Plain films showed no evidence of osteomyelitis or soft tissue gas. His procalcitonin was 0.07. His lactic acid was within normal limits and he had no fever or leukocytosis. He was not admitted to the hospital. Although he states he has already been on doxycycline for his foot he was given an additional prescription for clindamycin. He states his sugars have been "high" lately as high as in the 400s. His sugar on arrival here was 311. He states he ate after leaving [place] before coming here. He was given 30 milligrams of oxycodone at [facility] which he states did not help his pain.  The patient's goal appears to be admission to a hospital. He repeatedly emphasizes the erythema and pain in his foot as well as his elevated blood sugar. He expresses disappointment that he was not admitted in [place]. Per my interpretation of the diagnostic studies done at [facility] I do not see an indication for admission at this time.   Past Medical History:  Diagnosis Date  . Diabetes mellitus without complication (HCC)   . DVT (deep venous thrombosis) (HCC) 04/03/2016  . Essential hypertension   . Pulmonary embolism (HCC)   .  Rotator cuff dysfunction, left     Past Surgical History:  Procedure Laterality Date  . AMPUTATION TOE Right 10/19/2015  . APPENDECTOMY    . BACK SURGERY    . CHOLECYSTECTOMY    . FOOT AMPUTATION      Family History  Problem Relation Age of Onset  . Adopted: Yes  . Pulmonary embolism Neg Hx     Social History  Substance Use Topics  . Smoking status: Former Smoker    Types: Cigarettes  . Smokeless tobacco: Current User    Types: Chew  . Alcohol use No    Prior to Admission medications   Medication Sig Start Date End Date Taking? Authorizing Provider  gabapentin (NEURONTIN) 400 MG capsule Take 400-800 mg by mouth 3 (three) times daily. And then take 2 capsules at bedtime.     Historical Provider, MD  insulin aspart (NOVOLOG) 100 UNIT/ML injection Inject 16 Units into the skin 3 (three) times daily before meals. And then sliding scale as needed.     Historical Provider, MD  insulin glargine (LANTUS) 100 UNIT/ML injection Inject 80 Units into the skin 2 (two) times daily.     Historical Provider, MD  oxycodone (ROXICODONE) 30 MG immediate release tablet Take 30 mg by mouth every 4 (four) hours as needed for pain.    Historical Provider, MD  Rivaroxaban (XARELTO) 15 MG TABS tablet Take 15 mg by mouth 2 (two) times daily with a meal.    Historical Provider, MD    Allergies Tramadol; Ketorolac tromethamine; Morphine and related; and Suboxone  [buprenorphine hcl-naloxone hcl]  REVIEW OF SYSTEMS  Negative except as noted here or in the History of Present Illness.   PHYSICAL EXAMINATION  Initial Vital Signs Blood pressure 164/86, pulse 94, temperature 98.6 F (37 C), temperature source Oral, resp. rate 16, height 6\' 2"  (1.88 m), weight 260 lb (117.9 kg), SpO2 98 %.  Examination General: Well-developed, well-nourished male in no acute distress; appearance consistent with age of record HENT: normocephalic; atraumatic Eyes: pupils equal, round and reactive to light;  extraocular muscles intact Neck: supple Heart: regular rate and rhythm Lungs: clear to auscultation bilaterally Abdomen: soft; nondistended; nontender; bowel sounds present Extremities: Normal range of motion except right foot; forefoot amputation right foot with secondarily healing wound but no visible drainage, some erythema surrounding edges of wound but not extending up foot or leg:   Neurologic: Awake, alert and oriented; motor function intact in all extremities and symmetric; no facial droop Skin: Warm and dry   RESULTS  Summary of this visit's results, reviewed by myself:   EKG Interpretation  Date/Time:    Ventricular Rate:    PR Interval:    QRS Duration:   QT Interval:    QTC Calculation:   R Axis:     Text Interpretation:        Laboratory Studies: Results for orders placed or performed during the hospital encounter of 09/02/16 (from the past 24 hour(s))  POC CBG, ED     Status: Abnormal   Collection Time: 09/02/16  2:05 AM  Result Value Ref Range   Glucose-Capillary 311 (H) 65 - 99 mg/dL  CBG monitoring, ED     Status: Abnormal   Collection Time: 09/02/16  3:17 AM  Result Value Ref Range   Glucose-Capillary 421 (H) 65 - 99 mg/dL   Imaging Studies: No results found.  ED COURSE  Nursing notes and initial vitals signs, including pulse oximetry, reviewed.  Vitals:   09/02/16 0145 09/02/16 0146 09/02/16 0318  BP: 164/86  178/94  Pulse: 94  99  Resp: 16    Temp: 98.6 F (37 C)    TempSrc: Oral    SpO2: 98%  98%  Weight:  260 lb (117.9 kg)   Height:  6\' 2"  (1.88 m)    3:40 AM The patient was advised that I did not feel that admission was indicated at this time. I did offer him IV fluids and insulin for treatment of his hyperglycemia but he refused and elected to elope without waiting for discharge instructions. He initially was amenable to referral to a local orthopedist but as he left he stated "I'll contact my orthopedist in the morning".  PROCEDURES     ED DIAGNOSES     ICD-9-CM ICD-10-CM   1. Drug-seeking behavior 305.90 Z76.5        Paula LibraJohn Lejon Afzal, MD 09/02/16 [phone]

## 2017-04-28 IMAGING — CT CT ANGIO CHEST
2 of 6 series · 18 of 36 positions shown · IV contrast (Omni 300)
Comparison: Chest radiograph performed 04/11/2016

CLINICAL DATA: Acute onset of mid chest pain, radiating to the
back. Initial encounter.

EXAM:
CT ANGIOGRAPHY CHEST WITH CONTRAST
TECHNIQUE: Multidetector CT imaging of the chest was performed using the
standard protocol during bolus administration of intravenous
contrast. Multiplanar CT image reconstructions and MIPs were
obtained to evaluate the vascular anatomy.
CONTRAST:  100 mL of Isovue 370 IV contrast

[Series 6: pe thins · axial · 0.73mm/px · z∈[-341,-64]mm · 17 of 313 slices shown]
[im 18/313  lung]
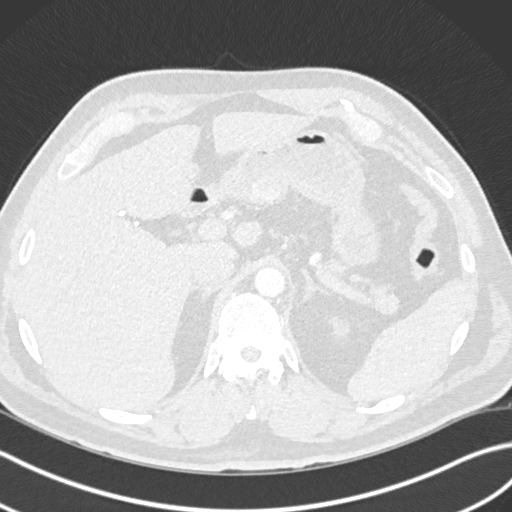
[im 35/313  mediastinal]
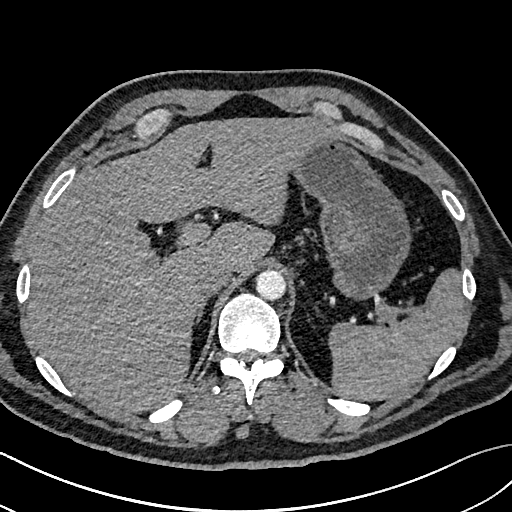
[im 53/313  lung]
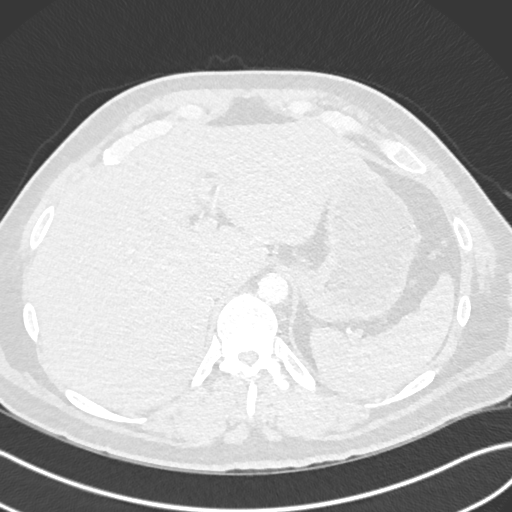
[im 70/313  mediastinal]
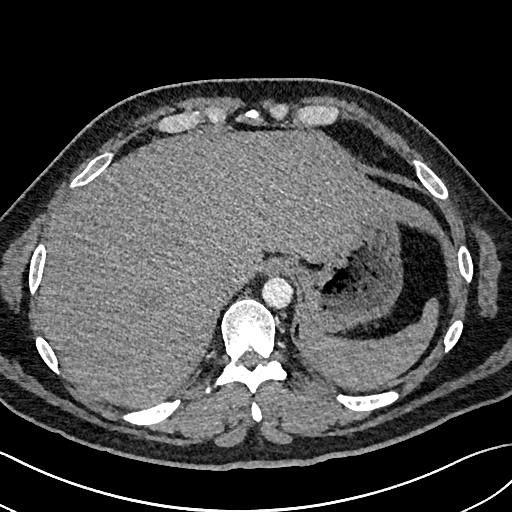
[im 87/313  lung]
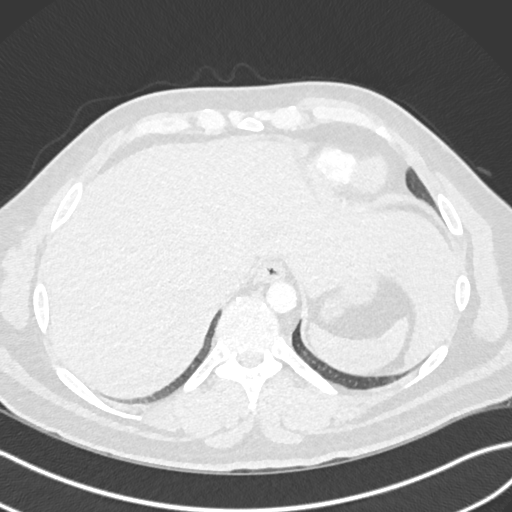
[im 105/313  mediastinal]
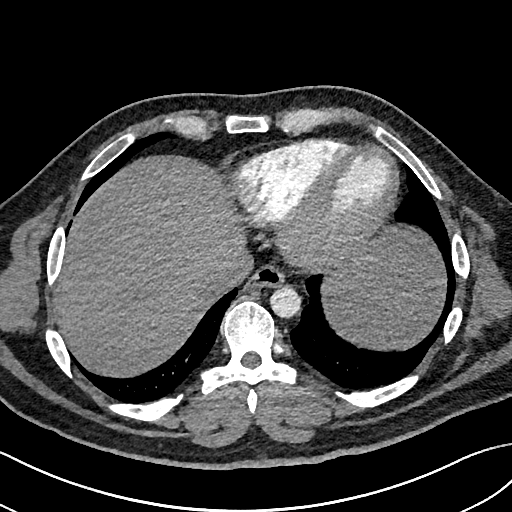
[im 122/313  lung]
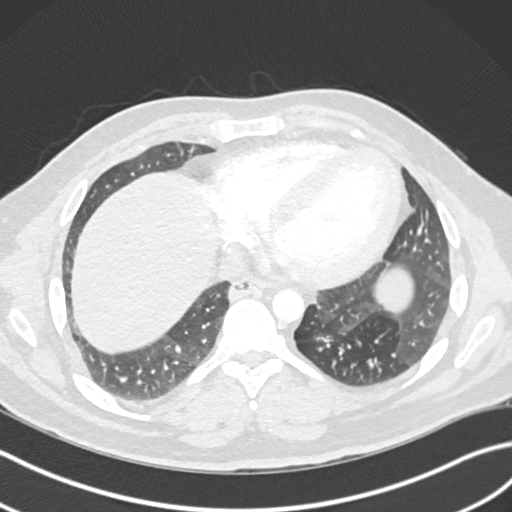
[im 139/313  mediastinal]
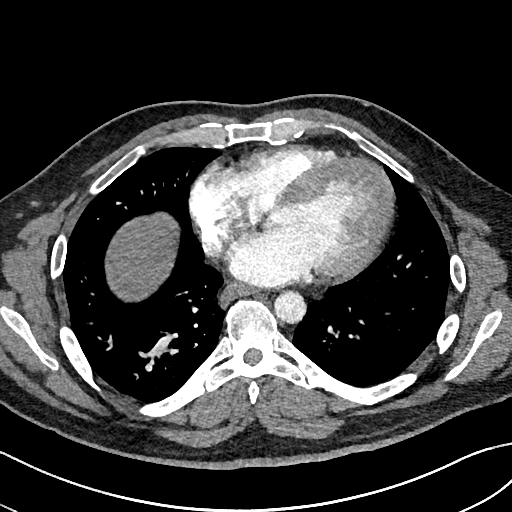
[im 157/313  lung]
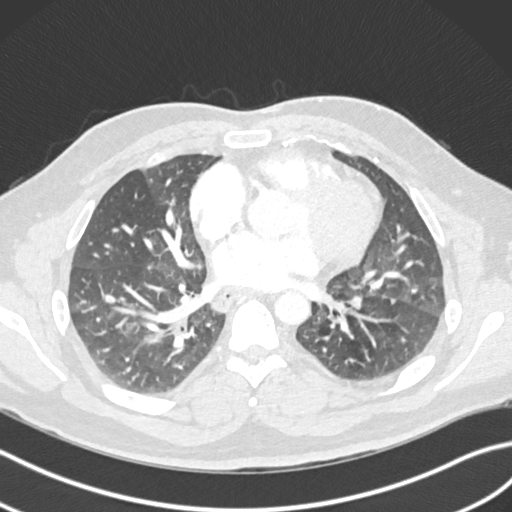
[im 174/313  mediastinal]
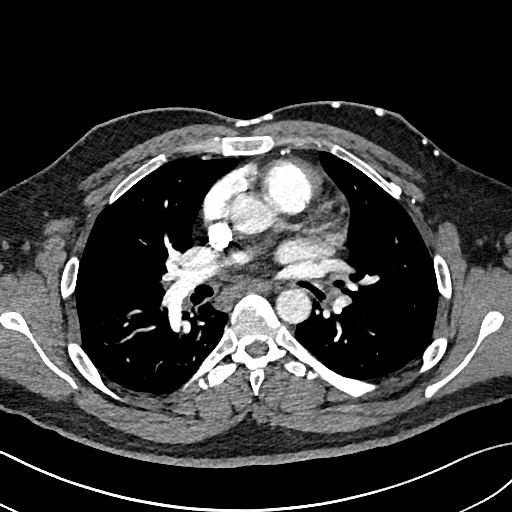
[im 191/313  lung]
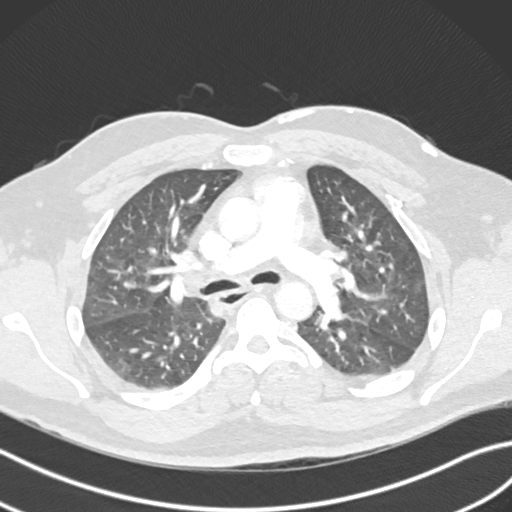
[im 209/313  mediastinal]
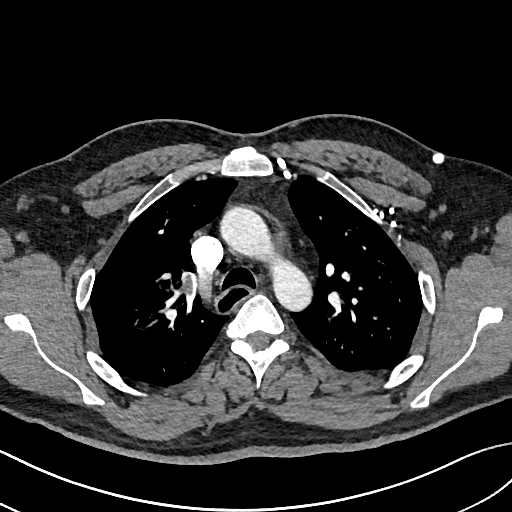
[im 226/313  lung]
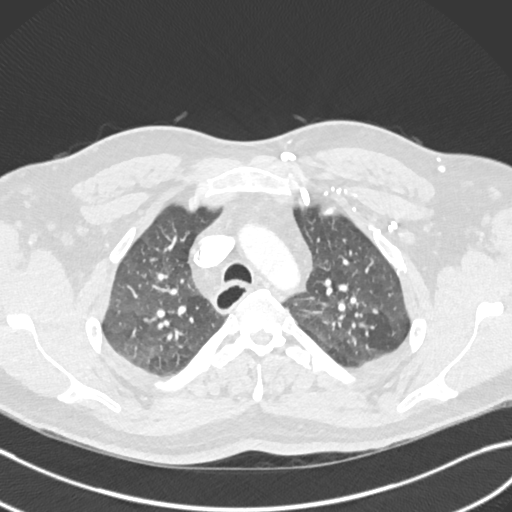
[im 243/313  mediastinal]
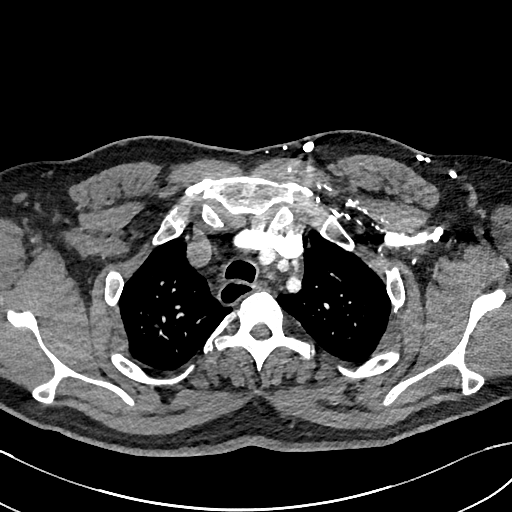
[im 261/313  lung]
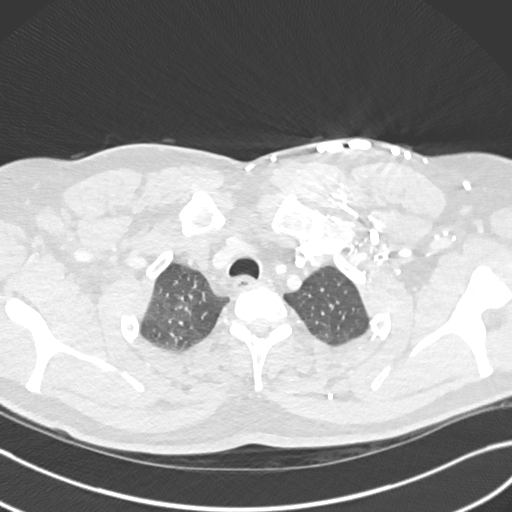
[im 278/313  mediastinal]
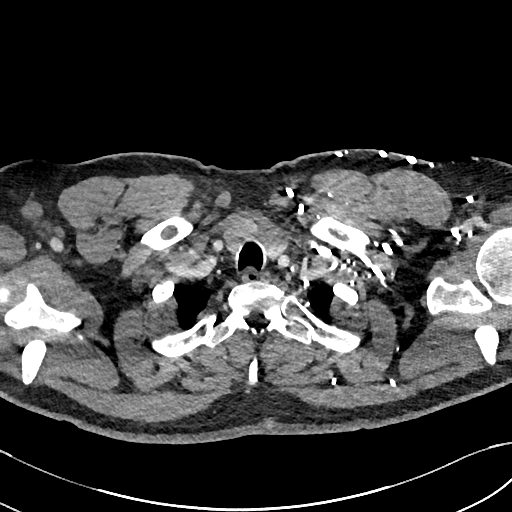
[im 295/313  lung]
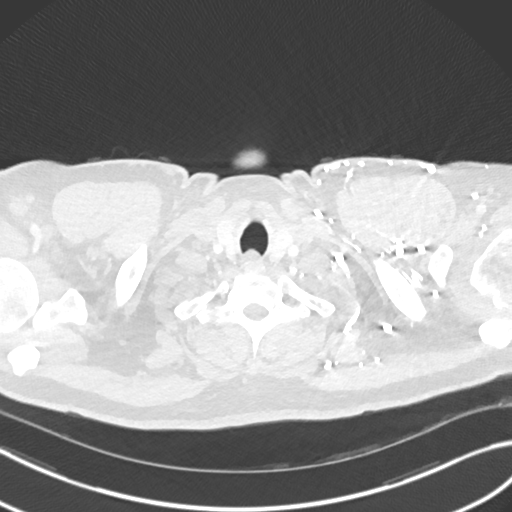

[Series 7: pe 2mm cor · coronal · 0.65mm/px · 1 of 129 slices shown]
[im 65/129  mediastinal]
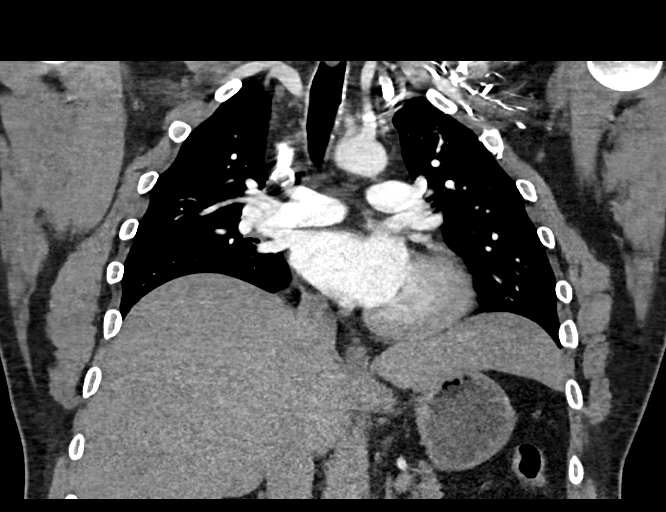

[18 of 36 positions shown; findings below may reference images not displayed]

FINDINGS: Cardiovascular: Pulmonary embolus is noted within pulmonary arteries
to the right upper lobe, right lower lobe and left lower lobe. The
RV/LV ratio of 1.07 is concerning for right heart strain and
submassive pulmonary embolus.

The heart is grossly unremarkable in appearance. The thoracic aorta
appears intact. The great vessels are unremarkable in appearance.

Mediastinum/Nodes: The mediastinum is grossly unremarkable. No
mediastinal lymphadenopathy is seen. No pericardial effusion is
identified. The thyroid gland is unremarkable in appearance. No
axillary lymphadenopathy is appreciated.

Lungs/Pleura: A vague mosaic pattern of parenchymal attenuation is
noted bilaterally, which may reflect a variety of processes,
including atelectasis, mild interstitial edema or infection. No
pleural effusion or pneumothorax is seen. No masses are identified.

Upper Abdomen: The visualized portions of the liver and spleen are
unremarkable. The patient is status post cholecystectomy, with clips
noted at the gallbladder fossa. The visualized portions of the
pancreas, adrenal glands and kidneys are within normal limits.

Musculoskeletal: No acute osseous abnormalities are identified. The
visualized musculature is unremarkable in appearance.

Review of the MIP images confirms the above findings.
IMPRESSION: 1. Pulmonary embolus within pulmonary artery branches to the right
upper lobe, right lower lobe and left lower lobe. CT evidence of
right heart strain (RV/LV Ratio = 1.07) consistent with at least
submassive (intermediate risk) PE. The presence of right heart
strain has been associated with an increased risk of morbidity and
mortality. Please activate Code PE by paging [phone].
2. Vague mosaic pattern of parenchymal attenuation noted
bilaterally, which may reflect a variety of processes, including
atelectasis, mild interstitial edema or infection.
Critical Value/emergent results were called by telephone at the time
of interpretation on 04/12/2016 at [DATE] to Dr. SARASI YANG, who
verbally acknowledged these results.

## 2023-07-21 ENCOUNTER — Inpatient Hospital Stay
Admit: 2023-07-21 | Discharge: 2023-07-21 | Disposition: A | Discharge status: Home / Self Care | Payer: MEDICARE | Attending: Student in an Organized Health Care Education/Training Program

## 2023-07-21 ENCOUNTER — Emergency Department: Admit: 2023-07-21 | Payer: MEDICARE

## 2023-07-21 DIAGNOSIS — M25561 Pain in right knee: Secondary | ICD-10-CM

## 2023-07-21 LAB — CBC WITH AUTO DIFFERENTIAL
Basophils %: 0.5 % (ref 0.0–1.0)
Basophils Absolute: 0.03 10*3/uL (ref 0.00–0.10)
Eosinophils %: 3.7 % (ref 0.0–7.0)
Eosinophils Absolute: 0.24 10*3/uL (ref 0.00–0.40)
Hematocrit: 41.5 % (ref 36.6–50.3)
Hemoglobin: 13.7 g/dL (ref 12.1–17.0)
Immature Granulocytes %: 0.3 % (ref 0.0–0.5)
Immature Granulocytes Absolute: 0.02 10*3/uL (ref 0.00–0.04)
Lymphocytes %: 16.4 % (ref 12.0–49.0)
Lymphocytes Absolute: 1.07 10*3/uL (ref 0.80–3.50)
MCH: 29.1 pg (ref 26.0–34.0)
MCHC: 33 g/dL (ref 30.0–36.5)
MCV: 88.1 fL (ref 80.0–99.0)
MPV: 9.8 fL (ref 8.9–12.9)
Monocytes %: 8.7 % (ref 5.0–13.0)
Monocytes Absolute: 0.57 10*3/uL (ref 0.00–1.00)
Neutrophils %: 70.4 % (ref 32.0–75.0)
Neutrophils Absolute: 4.61 10*3/uL (ref 1.80–8.00)
Nucleated RBCs: 0 /100{WBCs}
Platelets: 236 10*3/uL (ref 150–400)
RBC: 4.71 M/uL (ref 4.10–5.70)
RDW: 12.9 % (ref 11.5–14.5)
WBC: 6.5 10*3/uL (ref 4.1–11.1)
nRBC: 0 10*3/uL (ref 0.00–0.01)

## 2023-07-21 MED ORDER — SULFAMETHOXAZOLE-TRIMETHOPRIM 800-160 MG PO TABS
800-160 | ORAL_TABLET | Freq: Two times a day (BID) | ORAL | 0 refills | Status: AC
Start: 2023-07-21 — End: 2023-07-28

## 2023-07-21 MED ORDER — OXYCODONE HCL 5 MG PO TABS
5 | Freq: Once | ORAL | Status: AC
Start: 2023-07-21 — End: 2023-07-21
  Administered 2023-07-21: 20:00:00 5 mg via ORAL

## 2023-07-21 MED ORDER — ACETAMINOPHEN 500 MG PO TABS
500 | Freq: Once | ORAL | Status: DC
Start: 2023-07-21 — End: 2023-07-21

## 2023-07-21 MED FILL — OXYCODONE HCL 5 MG PO TABS: 5 MG | ORAL | Qty: 1

## 2023-07-21 NOTE — ED Provider Notes (Signed)
[facility]  EMERGENCY DEPARTMENT ENCOUNTER      Pt Name: Gary Ellis  MRN: [id number]  Birthdate 01-22-1976  Date of evaluation: 07/21/2023  Provider: Nelwyn Salisbury, APRN - NP      HISTORY OF PRESENT ILLNESS      Chief Complaint (CC): Wounds under prosthetic, worsening "red spots."    Past Medical History:  No date: Staph infection  Past Surgical History:  No date: LEG AMPUTATION BELOW KNEE; Right      The history is provided by the patient.           Nursing Notes were reviewed.    REVIEW OF SYSTEMS         Review of Systems   Constitutional:  Negative for activity change, chills, diaphoresis, fatigue and fever.   HENT:  Negative for congestion, sinus pressure, sinus pain and trouble swallowing.    Eyes:  Negative for pain, redness and visual disturbance.   Respiratory:  Negative for cough and shortness of breath.    Cardiovascular:  Negative for chest pain and palpitations.   Gastrointestinal:  Negative for abdominal distention, abdominal pain, nausea and vomiting.   Musculoskeletal:  Negative for arthralgias, neck pain and neck stiffness.   Skin:  Positive for color change (erythema to right knee). Negative for pallor, rash and wound.   Neurological:  Negative for dizziness and speech difficulty.   Hematological:  Does not bruise/bleed easily.   Psychiatric/Behavioral:  Negative for agitation. The patient is not nervous/anxious.            PAST MEDICAL HISTORY     Past Medical History:   Diagnosis Date    Staph infection          SURGICAL HISTORY       Past Surgical History:   Procedure Laterality Date    LEG AMPUTATION BELOW KNEE Right          CURRENT MEDICATIONS       Previous Medications    No medications on file       ALLERGIES     Haloperidol, Hydrocodone-acetaminophen, Ketorolac, Magnesium hydroxide, Propofol, Ciprofloxacin, Ketamine, Lidocaine, Morphine and codeine, Meperidine hcl, Clindamycin, Cyclobenzaprine, Empagliflozin, Meperidine, Methocarbamol, and Tramadol    FAMILY  HISTORY     History reviewed. No pertinent family history.       SOCIAL HISTORY       Social History     Socioeconomic History    Marital status: Single     Spouse name: None    Number of children: None    Years of education: None    Highest education level: None     Social Determinants of Health     Transportation Needs: No Transportation Needs (03/12/2023)    Received from [facility] - Transportation     In the past 12 months, has lack of transportation kept you from medical appointments or from getting medications?: No     Lack of Transportation (Non-Medical): No    Received from [facility]    Social Network    Received from [facility]    HITS         PHYSICAL EXAM       ED Triage Vitals [07/21/23 1308]   BP Systolic BP Percentile Diastolic BP Percentile Temp Temp Source Pulse Respirations SpO2   (!) 162/77 -- -- 97.5 F (36.4 C) Tympanic 77 17 97 %      Height Weight - Scale  1.905 m (6\' 3" ) 117.8 kg (259 lb 11.2 oz)             Body mass index is 32.46 kg/m.    Physical Exam  Vitals and nursing note reviewed.   Constitutional:       Appearance: Normal appearance. He is not ill-appearing.   HENT:      Head: Normocephalic and atraumatic.      Right Ear: External ear normal.      Left Ear: External ear normal.      Nose: Nose normal. No congestion.      Mouth/Throat:      Mouth: Mucous membranes are moist.   Eyes:      General:         Right eye: No discharge.         Left eye: No discharge.      Conjunctiva/sclera: Conjunctivae normal.      Pupils: Pupils are equal, round, and reactive to light.   Cardiovascular:      Rate and Rhythm: Normal rate.      Pulses: Normal pulses.   Pulmonary:      Effort: Pulmonary effort is normal. No respiratory distress.   Chest:      Chest wall: No tenderness.   Abdominal:      General: There is no distension.      Tenderness: There is no guarding.   Musculoskeletal:         General: Tenderness (right knee) present. No swelling. Normal  range of motion.      Cervical back: Normal range of motion and neck supple. No tenderness.   Skin:     General: Skin is warm and dry.      Capillary Refill: Capillary refill takes less than 2 seconds.      Findings: Erythema (right knee) present.   Neurological:      General: No focal deficit present.      Mental Status: He is alert and oriented to person, place, and time.      Sensory: No sensory deficit.      Motor: No weakness.   Psychiatric:         Mood and Affect: Mood normal.         Behavior: Behavior normal.         Thought Content: Thought content normal.         Judgment: Judgment normal.             EMERGENCY DEPARTMENT COURSE and DIFFERENTIAL DIAGNOSIS/MDM:   Vitals:    Vitals:    07/21/23 1308   BP: (!) 162/77   Pulse: 77   Resp: 17   Temp: 97.5 F (36.4 C)   TempSrc: Tympanic   SpO2: 97%   Weight: 117.8 kg (259 lb 11.2 oz)   Height: 1.905 m (6\' 3" )         Medical Decision Making  Differential Diagnosis (DD): Skin infection (e.g., MRSA), Abscess or cellulitis, Prosthetic-related infection, and others.    History of Present Illness (HPI): 48 year old male with a right below-knee amputation (BKA) presents with complaints of wounds under his prosthetic. The patient has a history of a past staph infection and was started on Doxycycline on Sunday, stating he had "extra at his house." He reports worsening "red spots" and is concerned about MRSA. The spots started as a pimple on his right knee, which he popped, and now it has become larger and more painful. He denies fever or chills and  left [facility] AMA this morning. He has not had an MRI. Denies any chest pain, shortness of breath, dizziness, nausea or vomiting, fever or chills. No further complaints.    After physical assessment imaging and laboratory data were obtained.  CMP and CBC are both unremarkable.  Wound culture was obtained and is pending.  X-ray of the right knee shows no acute osseous or articular abnormality.  Does have a small joint effusion.   Patient is requesting IV pain medicine and IV antibiotics as he feels as though his rash is spreading.  This provider does not appreciate any rash other than the top of his knee which looks as if it is a popped ingrown hair.  Vital signs are stable.  Patient was discharged home and will follow-up with PCP as well as orthopedics as an outpatient.  Left hospital agitated that we are not admitting him for IV antibiotics at this point as well as IV pain medicine.    Any available vitals, labs, images, nursing notes, medications, allergies, PMH, PSH and/or previous records in the chart were reviewed. All of these were considered in the medical decision making process. I individually reviewed any labs and any images obtained. This was discussed with my attending and he/she is in agreement with plan of care.               Problems Addressed:  Acute pain of right knee: acute illness or injury     Details: Acute on chronic.    Amount and/or Complexity of Data Reviewed  Labs: ordered. Decision-making details documented in ED Course.  Radiology: ordered. Decision-making details documented in ED Course.    Risk  OTC drugs.  Prescription drug management.      REASSESSMENT          CONSULTS:  None    PROCEDURES:     Procedures    Labs Reviewed   CULTURE, WOUND (WITH GRAM STAIN)   CBC WITH AUTO DIFFERENTIAL   COMPREHENSIVE METABOLIC PANEL       XR KNEE RIGHT (3 VIEWS)   Final Result   No acute osseous or articular abnormality. Small knee joint   effusion.      Electronically signed by Eual Fines        4:11 PM  Results and findings have been communicated and explained thoroughly to patient and family members that are present.  Patient has been educated on strict return precautions as well as instructions for conservative care and follow-up. Patient will be discharged with prescription for Bactrim and has been given opportunity to ask questions about this new medication.    Patient verbalizes understanding and no socioeconomic  barriers to care were identified during this visit. Patient expresses no further concerns at this time and will be discharged with AVS and education paperwork.   Pt agrees to F/U as instructed and agrees to return to ED upon further deterioration. Pt is ready to go home.    Nelwyn Salisbury, DNP      (Please note that portions of this note were completed with a voice recognition program.  Efforts were made to edit the dictations but occasionally words are mis-transcribed.)             Tammatha Cobb, Magnus Sinning, APRN - NP  07/23/23 1619

## 2023-07-21 NOTE — ED Notes (Signed)
Pt discharged in stable condition at this time. MD/APP reviewed discharge instructions, prescriptions, and follow up with patient at bedside. Pt verbalized understanding and denies any needs or questions at this time.   Pt NAD on DC ambulatory and dissatisfied with care. Pt refused to take paperwork and RVS.

## 2023-07-22 LAB — COMPREHENSIVE METABOLIC PANEL
ALT: 42 U/L (ref 12–78)
AST: 30 U/L (ref 15–37)
Albumin/Globulin Ratio: 0.8 — ABNORMAL LOW (ref 1.1–2.2)
Albumin: 3.6 g/dL (ref 3.5–5.0)
Alk Phosphatase: 75 U/L (ref 45–117)
Anion Gap: 9 mmol/L (ref 2–12)
BUN/Creatinine Ratio: 12 (ref 12–20)
BUN: 15 mg/dL (ref 6–20)
CO2: 24 mmol/L (ref 21–32)
Calcium: 9.4 mg/dL (ref 8.5–10.1)
Chloride: 101 mmol/L (ref 97–108)
Creatinine: 1.22 mg/dL (ref 0.70–1.30)
Est, Glom Filt Rate: 74 mL/min/{1.73_m2} (ref >60)
Globulin: 4.3 g/dL — ABNORMAL HIGH (ref 2.0–4.0)
Glucose: 191 mg/dL — ABNORMAL HIGH (ref 65–100)
Potassium: 4.4 mmol/L (ref 3.5–5.1)
Sodium: 134 mmol/L — ABNORMAL LOW (ref 136–145)
Total Bilirubin: 0.5 mg/dL (ref 0.2–1.0)
Total Protein: 7.9 g/dL (ref 6.4–8.2)

## 2023-07-23 LAB — CULTURE, WOUND (WITH GRAM STAIN)
Culture: NO GROWTH
Gram Stain: NONE SEEN
Gram Stain: NONE SEEN
